# Patient Record
Sex: Male | Born: 1935 | Race: White | Hispanic: No | Marital: Married | Smoking: Never smoker
Health system: Southern US, Community
[De-identification: ages and names within clinical notes are randomized; demographics above are authoritative.]

## PROBLEM LIST (undated history)

## (undated) DIAGNOSIS — I493 Ventricular premature depolarization: Secondary | ICD-10-CM

## (undated) DIAGNOSIS — Z972 Presence of dental prosthetic device (complete) (partial): Secondary | ICD-10-CM

## (undated) DIAGNOSIS — E875 Hyperkalemia: Secondary | ICD-10-CM

## (undated) DIAGNOSIS — E785 Hyperlipidemia, unspecified: Secondary | ICD-10-CM

## (undated) DIAGNOSIS — K219 Gastro-esophageal reflux disease without esophagitis: Secondary | ICD-10-CM

## (undated) DIAGNOSIS — E119 Type 2 diabetes mellitus without complications: Secondary | ICD-10-CM

## (undated) DIAGNOSIS — E538 Deficiency of other specified B group vitamins: Secondary | ICD-10-CM

## (undated) DIAGNOSIS — N529 Male erectile dysfunction, unspecified: Secondary | ICD-10-CM

## (undated) DIAGNOSIS — J189 Pneumonia, unspecified organism: Secondary | ICD-10-CM

## (undated) DIAGNOSIS — N189 Chronic kidney disease, unspecified: Secondary | ICD-10-CM

## (undated) DIAGNOSIS — I10 Essential (primary) hypertension: Secondary | ICD-10-CM

## (undated) DIAGNOSIS — G4730 Sleep apnea, unspecified: Secondary | ICD-10-CM

## (undated) DIAGNOSIS — K5790 Diverticulosis of intestine, part unspecified, without perforation or abscess without bleeding: Secondary | ICD-10-CM

## (undated) DIAGNOSIS — R001 Bradycardia, unspecified: Secondary | ICD-10-CM

## (undated) DIAGNOSIS — D649 Anemia, unspecified: Secondary | ICD-10-CM

## (undated) HISTORY — PX: HERNIA REPAIR: SHX51

## (undated) HISTORY — PX: COLONOSCOPY: SHX174

---

## 2005-07-03 ENCOUNTER — Ambulatory Visit: Payer: Self-pay | Admitting: Gastroenterology

## 2012-03-05 DIAGNOSIS — N189 Chronic kidney disease, unspecified: Secondary | ICD-10-CM | POA: Insufficient documentation

## 2012-03-05 DIAGNOSIS — D649 Anemia, unspecified: Secondary | ICD-10-CM | POA: Insufficient documentation

## 2012-08-16 ENCOUNTER — Ambulatory Visit: Payer: Self-pay | Admitting: Radiology

## 2012-08-16 ENCOUNTER — Ambulatory Visit: Payer: Self-pay | Admitting: General Practice

## 2012-12-16 DIAGNOSIS — E538 Deficiency of other specified B group vitamins: Secondary | ICD-10-CM | POA: Insufficient documentation

## 2014-01-05 DIAGNOSIS — I10 Essential (primary) hypertension: Secondary | ICD-10-CM | POA: Insufficient documentation

## 2014-12-29 ENCOUNTER — Encounter: Payer: Self-pay | Admitting: Unknown Physician Specialty

## 2015-05-05 ENCOUNTER — Encounter: Payer: Self-pay | Admitting: Podiatry

## 2015-05-05 ENCOUNTER — Ambulatory Visit (INDEPENDENT_AMBULATORY_CARE_PROVIDER_SITE_OTHER): Payer: Medicare Other

## 2015-05-05 ENCOUNTER — Ambulatory Visit (INDEPENDENT_AMBULATORY_CARE_PROVIDER_SITE_OTHER): Payer: Medicare Other | Admitting: Podiatry

## 2015-05-05 VITALS — BP 108/74 | HR 71 | Resp 16

## 2015-05-05 DIAGNOSIS — B351 Tinea unguium: Secondary | ICD-10-CM | POA: Diagnosis not present

## 2015-05-05 DIAGNOSIS — M2040 Other hammer toe(s) (acquired), unspecified foot: Secondary | ICD-10-CM | POA: Diagnosis not present

## 2015-05-05 DIAGNOSIS — E875 Hyperkalemia: Secondary | ICD-10-CM | POA: Insufficient documentation

## 2015-05-05 DIAGNOSIS — E785 Hyperlipidemia, unspecified: Secondary | ICD-10-CM | POA: Insufficient documentation

## 2015-05-05 DIAGNOSIS — N183 Chronic kidney disease, stage 3 unspecified: Secondary | ICD-10-CM | POA: Insufficient documentation

## 2015-05-05 DIAGNOSIS — M79676 Pain in unspecified toe(s): Secondary | ICD-10-CM | POA: Diagnosis not present

## 2015-05-05 DIAGNOSIS — E119 Type 2 diabetes mellitus without complications: Secondary | ICD-10-CM

## 2015-05-05 DIAGNOSIS — M2010 Hallux valgus (acquired), unspecified foot: Secondary | ICD-10-CM

## 2015-05-05 DIAGNOSIS — E1122 Type 2 diabetes mellitus with diabetic chronic kidney disease: Secondary | ICD-10-CM | POA: Insufficient documentation

## 2015-05-05 DIAGNOSIS — N529 Male erectile dysfunction, unspecified: Secondary | ICD-10-CM | POA: Insufficient documentation

## 2015-05-05 NOTE — Patient Instructions (Signed)
Diabetes and Foot Care Diabetes may cause you to have problems because of poor blood supply (circulation) to your feet and legs. This may cause the skin on your feet to become thinner, break easier, and heal more slowly. Your skin may become dry, and the skin may peel and crack. You may also have nerve damage in your legs and feet causing decreased feeling in them. You may not notice minor injuries to your feet that could lead to infections or more serious problems. Taking care of your feet is one of the most important things you can do for yourself.  HOME CARE INSTRUCTIONS  Wear shoes at all times, even in the house. Do not go barefoot. Bare feet are easily injured.  Check your feet daily for blisters, cuts, and redness. If you cannot see the bottom of your feet, use a mirror or ask someone for help.  Wash your feet with warm water (do not use hot water) and mild soap. Then pat your feet and the areas between your toes until they are completely dry. Do not soak your feet as this can dry your skin.  Apply a moisturizing lotion or petroleum jelly (that does not contain alcohol and is unscented) to the skin on your feet and to dry, brittle toenails. Do not apply lotion between your toes.  Trim your toenails straight across. Do not dig under them or around the cuticle. File the edges of your nails with an emery board or nail file.  Do not cut corns or calluses or try to remove them with medicine.  Wear clean socks or stockings every day. Make sure they are not too tight. Do not wear knee-high stockings since they may decrease blood flow to your legs.  Wear shoes that fit properly and have enough cushioning. To break in new shoes, wear them for just a few hours a day. This prevents you from injuring your feet. Always look in your shoes before you put them on to be sure there are no objects inside.  Do not cross your legs. This may decrease the blood flow to your feet.  If you find a minor scrape,  cut, or break in the skin on your feet, keep it and the skin around it clean and dry. These areas may be cleansed with mild soap and water. Do not cleanse the area with peroxide, alcohol, or iodine.  When you remove an adhesive bandage, be sure not to damage the skin around it.  If you have a wound, look at it several times a day to make sure it is healing.  Do not use heating pads or hot water bottles. They may burn your skin. If you have lost feeling in your feet or legs, you may not know it is happening until it is too late.  Make sure your health care provider performs a complete foot exam at least annually or more often if you have foot problems. Report any cuts, sores, or bruises to your health care provider immediately. SEEK MEDICAL CARE IF:   You have an injury that is not healing.  You have cuts or breaks in the skin.  You have an ingrown nail.  You notice redness on your legs or feet.  You feel burning or tingling in your legs or feet.  You have pain or cramps in your legs and feet.  Your legs or feet are numb.  Your feet always feel cold. SEEK IMMEDIATE MEDICAL CARE IF:   There is increasing redness,   swelling, or pain in or around a wound.  There is a red line that goes up your leg.  Pus is coming from a wound.  You develop a fever or as directed by your health care provider.  You notice a bad smell coming from an ulcer or wound.   This information is not intended to replace advice given to you by your health care provider. Make sure you discuss any questions you have with your health care provider.   Document Released: 02/04/2000 Document Revised: 10/09/2012 Document Reviewed: 07/16/2012 Elsevier Interactive Patient Education 2016 [employer] Inc[employer]  

## 2015-05-05 NOTE — Progress Notes (Signed)
   Subjective:    Patient ID: Jermaine White, male    DOB: 04/27/1935, 80 y.o.   MRN: [id number]  HPI: He presents today with a chief concern of an overlapping second digit hallux left. He states that he is a diabetic but really has no podiatric issues associated with diabetes. 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 He states that he has always had bunion deformities, which she states comes from his family, however he has just recently developed the overlapping second digit on the left foot. He feels that it came about after [place]. He states that just seemed to pop up and rollover. He's concerned about excessive pressure of the second toe on the lateral aspect of the nail plate of the hallux. He states that there is some discoloration and discomfort. He denies trauma to the foot. He relates that his blood sugar runs very good and that his doctor is satisfied with his progress.    Review of Systems  Musculoskeletal: Positive for arthralgias.  All other systems reviewed and are negative.      Objective:   Physical Exam: I have reviewed his past medical history medications allergy surgery social history and review of systems. Vital signs are stable he is alert and oriented 3. He presents in no apparent distress. Pulses are strongly palpable capillary fill times immediate bilateral. Neurologic sensorium is intact per Semmes-Weinstein monofilament and vibratory sensation. Deep tendon reflexes are intact and muscle strength appears to be +5/5 dorsiflexors plantar flexors and inverters and everters. Orthopedic evaluation demonstrates a rectus rear foot and ankle with severe bunion deformities bilateral. Hammertoe deformity second digit bilateral is present with the left foot being the worst. There is a complete dislocation of the second toe on the left foot which overlaps the hallux from lateral to medial. Radiographs confirm complete dislocation of the  second toe bilaterally with severe hammertoe deformity. Severe bunion deformities with an increase in the first intermetatarsal angle greater than 25. Hallux abductus angle is high as well and is resulting in dislocation/subluxation of the first metatarsophalangeal joint. Bone stock appears to be relatively normal. There are no other lesions or wounds. He does have some discoloration to the lateral nailbed associated with bruising from the pressure of the second toe from shoe gear. His toenails are long and dystrophic and painful on palpation.      Assessment & Plan:  Assessment: Diabetes mellitus without diabetic complication. Severe hallux abductovalgus deformity bilateral. Severe hammertoe deformity second bilateral left greater than right with complete dislocation of the second metatarsophalangeal joint left foot. Pain in limb secondary to nail dystrophy bilateral.  Plan: Discussed etiology pathology conservative versus surgical therapies. He is currently requesting surgical intervention. I expressed to him that the best option with a painful second toe and the pressure upon the hallux would be to remove the toe itself. I don't think that he was very excited about this proposition. I expressed to him that it would be nearly impossible to correct such a severe dislocation and a severe bunion deformity. He would like another opinion. At this point I'm requesting that he follow-up with Dr. Ardelle AntonWagoner for second opinion. I also debrided his nails today.

## 2015-05-25 ENCOUNTER — Encounter: Payer: Self-pay | Admitting: Podiatry

## 2015-05-25 ENCOUNTER — Ambulatory Visit (INDEPENDENT_AMBULATORY_CARE_PROVIDER_SITE_OTHER): Payer: Medicare Other | Admitting: Podiatry

## 2015-05-25 VITALS — BP 87/62 | HR 83 | Resp 18

## 2015-05-25 DIAGNOSIS — M2010 Hallux valgus (acquired), unspecified foot: Secondary | ICD-10-CM | POA: Diagnosis not present

## 2015-05-25 DIAGNOSIS — M2040 Other hammer toe(s) (acquired), unspecified foot: Secondary | ICD-10-CM | POA: Diagnosis not present

## 2015-05-25 NOTE — Progress Notes (Signed)
Patient ID: Jermaine White, male   DOB: 06/25/1935, 80 y.o.   MRN: [id number]  Subjective: 80 year old male presents the office today for second opinion in regards to painful second toe overlapping the hallux left side worse than the right. He is tried shoe gear modifications, padding offloading without any relief. Continue pain the second toe. He recently saw Dr.-recommended amputation of the second toe. Denies any systemic complaints such as fevers, chills, nausea, vomiting. No acute changes since last appointment, and no other complaints at this time.   Objective: AAO x3, NAD DP/PT pulses palpable bilaterally, CRT less than 3 seconds Protective sensation intact with Simms Weinstein monofilament There is severe HAV present bilaterally the left side worse in the right second digit is chronically dislocated overlapping the hallux. There is tenderness overlying the second toe. There is no edema. There is slight erythema overlying the medial facet of the first metatarsal and the dorsal second toe from irritation shoe gear. No other areas of tenderness.  No areas of pinpoint bony tenderness or pain with vibratory sensation. MMT 5/5, ROM WNL. No edema, erythema, increase in warmth to bilateral lower extremities.  No open lesions or pre-ulcerative lesions.  No pain with calf compression, swelling, warmth, erythema  Assessment: 80 year old male with significant HAV present and chronically dislocated second MTPJ.  Plan: -All treatment options discussed with the patient including all alternatives, risks, complications.  -Previous x-rays are reviewed. -Treatment options were discussed including both conservative and surgical treatment options. I discussed conservative treatment including shoe changes and more comminuted insert from what he has now. If he would like to proceed with surgery I discussed multiple treatment options. Assessment second toe amputation versus reconstruction. He'll consider his  options. 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 -Patient encouraged to call the office with any questions, concerns, change in symptoms.   Ovid CurdMatthew Wagoner, DPM

## 2015-08-18 ENCOUNTER — Ambulatory Visit: Payer: Medicare Other | Admitting: Podiatry

## 2015-09-03 ENCOUNTER — Other Ambulatory Visit: Payer: Self-pay | Admitting: Physician Assistant

## 2015-09-03 DIAGNOSIS — M5416 Radiculopathy, lumbar region: Secondary | ICD-10-CM

## 2015-09-06 ENCOUNTER — Ambulatory Visit
Admission: RE | Admit: 2015-09-06 | Discharge: 2015-09-06 | Disposition: A | Discharge status: Home / Self Care | Payer: Medicare Other | Source: Ambulatory Visit | Attending: Physician Assistant | Admitting: Physician Assistant

## 2015-09-06 DIAGNOSIS — M4806 Spinal stenosis, lumbar region: Secondary | ICD-10-CM | POA: Diagnosis not present

## 2015-09-06 DIAGNOSIS — M5416 Radiculopathy, lumbar region: Secondary | ICD-10-CM | POA: Diagnosis not present

## 2015-09-06 DIAGNOSIS — M5126 Other intervertebral disc displacement, lumbar region: Secondary | ICD-10-CM | POA: Insufficient documentation

## 2015-09-13 ENCOUNTER — Ambulatory Visit: Payer: Medicare Other | Admitting: Podiatry

## 2015-09-15 ENCOUNTER — Ambulatory Visit (INDEPENDENT_AMBULATORY_CARE_PROVIDER_SITE_OTHER): Payer: Medicare Other | Admitting: Podiatry

## 2015-09-15 DIAGNOSIS — M79676 Pain in unspecified toe(s): Secondary | ICD-10-CM | POA: Diagnosis not present

## 2015-09-15 DIAGNOSIS — B351 Tinea unguium: Secondary | ICD-10-CM

## 2015-09-15 NOTE — Progress Notes (Signed)
He presents today for follow-up of his painfully elongated toenails. Reminding me that he is diabetic.  Objective: Vital signs are stable he is alert and oriented 3. Pulses are palpable. Severe hammertoe deformity with overlapping hammertoe and bunion deformity. Toenails are thick yellow dystrophic onychomycotic. No open lesions or wounds noted.  Assessment: Diabetes mellitus severe digital deformities bilateral. Pain in limb secondary to onychomycosis.  Plan: Debridement of toenails 1 through 5 bilateral. Follow up with me in 3 months

## 2015-12-14 ENCOUNTER — Ambulatory Visit (INDEPENDENT_AMBULATORY_CARE_PROVIDER_SITE_OTHER): Payer: Medicare Other | Admitting: Podiatry

## 2015-12-14 DIAGNOSIS — E0843 Diabetes mellitus due to underlying condition with diabetic autonomic (poly)neuropathy: Secondary | ICD-10-CM

## 2015-12-14 DIAGNOSIS — B351 Tinea unguium: Secondary | ICD-10-CM | POA: Diagnosis not present

## 2015-12-14 DIAGNOSIS — L608 Other nail disorders: Secondary | ICD-10-CM

## 2015-12-14 DIAGNOSIS — L603 Nail dystrophy: Secondary | ICD-10-CM

## 2015-12-14 DIAGNOSIS — M79609 Pain in unspecified limb: Secondary | ICD-10-CM

## 2015-12-15 ENCOUNTER — Ambulatory Visit: Payer: Medicare Other | Admitting: Podiatry

## 2015-12-26 NOTE — Progress Notes (Signed)
SUBJECTIVE Patient with a history of diabetes mellitus presents to office today complaining of elongated, thickened nails. Pain while ambulating in shoes. Patient is unable to trim their own nails.   Allergies  Allergen Reactions  . Sulfa Antibiotics Other (See Comments)    OBJECTIVE General Patient is awake, alert, and oriented x 3 and in no acute distress. Derm Skin is dry and supple bilateral. Negative open lesions or macerations. Remaining integument unremarkable. Nails are tender, long, thickened and dystrophic with subungual debris, consistent with onychomycosis, 1-5 bilateral. No signs of infection noted. Vasc  DP and PT pedal pulses palpable bilaterally. Temperature gradient within normal limits.  Neuro Epicritic and protective threshold sensation diminished bilaterally.  Musculoskeletal Exam No symptomatic pedal deformities noted bilateral. Muscular strength within normal limits.  ASSESSMENT 1. Diabetes Mellitus w/ peripheral neuropathy 2. Onychomycosis of nail due to dermatophyte bilateral 3. Pain in foot bilateral  PLAN OF CARE 1. Patient evaluated today. 2. Instructed to maintain good pedal hygiene and foot care. Stressed importance of controlling blood sugar.  3. Mechanical debridement of nails 1-5 bilaterally performed using a nail nipper. Filed with dremel without incident.  4. Return to clinic in 3 mos.    Felecia ShellingBrent M Ossie Yebra, DPM

## 2016-01-25 ENCOUNTER — Ambulatory Visit: Payer: Medicare Other | Admitting: Registered Nurse

## 2016-01-25 ENCOUNTER — Encounter
Admission: RE | Disposition: A | Discharge status: Home / Self Care | Payer: Self-pay | Source: Ambulatory Visit | Attending: Gastroenterology

## 2016-01-25 ENCOUNTER — Ambulatory Visit
Admission: RE | Admit: 2016-01-25 | Discharge: 2016-01-25 | Disposition: A | Discharge status: Home / Self Care | Payer: Medicare Other | Source: Ambulatory Visit | Attending: Gastroenterology | Admitting: Gastroenterology

## 2016-01-25 DIAGNOSIS — G4730 Sleep apnea, unspecified: Secondary | ICD-10-CM | POA: Diagnosis not present

## 2016-01-25 DIAGNOSIS — E538 Deficiency of other specified B group vitamins: Secondary | ICD-10-CM | POA: Insufficient documentation

## 2016-01-25 DIAGNOSIS — I129 Hypertensive chronic kidney disease with stage 1 through stage 4 chronic kidney disease, or unspecified chronic kidney disease: Secondary | ICD-10-CM | POA: Diagnosis not present

## 2016-01-25 DIAGNOSIS — Z7982 Long term (current) use of aspirin: Secondary | ICD-10-CM | POA: Diagnosis not present

## 2016-01-25 DIAGNOSIS — K219 Gastro-esophageal reflux disease without esophagitis: Secondary | ICD-10-CM | POA: Insufficient documentation

## 2016-01-25 DIAGNOSIS — K5790 Diverticulosis of intestine, part unspecified, without perforation or abscess without bleeding: Secondary | ICD-10-CM

## 2016-01-25 DIAGNOSIS — N189 Chronic kidney disease, unspecified: Secondary | ICD-10-CM | POA: Diagnosis not present

## 2016-01-25 DIAGNOSIS — E785 Hyperlipidemia, unspecified: Secondary | ICD-10-CM | POA: Insufficient documentation

## 2016-01-25 DIAGNOSIS — Z7984 Long term (current) use of oral hypoglycemic drugs: Secondary | ICD-10-CM | POA: Diagnosis not present

## 2016-01-25 DIAGNOSIS — E875 Hyperkalemia: Secondary | ICD-10-CM | POA: Diagnosis not present

## 2016-01-25 DIAGNOSIS — E1122 Type 2 diabetes mellitus with diabetic chronic kidney disease: Secondary | ICD-10-CM | POA: Diagnosis not present

## 2016-01-25 DIAGNOSIS — Z1211 Encounter for screening for malignant neoplasm of colon: Secondary | ICD-10-CM | POA: Diagnosis present

## 2016-01-25 DIAGNOSIS — Z79899 Other long term (current) drug therapy: Secondary | ICD-10-CM | POA: Diagnosis not present

## 2016-01-25 HISTORY — DX: Chronic kidney disease, unspecified: N18.9

## 2016-01-25 HISTORY — DX: Sleep apnea, unspecified: G47.30

## 2016-01-25 HISTORY — DX: Anemia, unspecified: D64.9

## 2016-01-25 HISTORY — DX: Male erectile dysfunction, unspecified: N52.9

## 2016-01-25 HISTORY — DX: Type 2 diabetes mellitus without complications: E11.9

## 2016-01-25 HISTORY — DX: Gastro-esophageal reflux disease without esophagitis: K21.9

## 2016-01-25 HISTORY — DX: Hyperkalemia: E87.5

## 2016-01-25 HISTORY — DX: Essential (primary) hypertension: I10

## 2016-01-25 HISTORY — PX: COLONOSCOPY WITH PROPOFOL: SHX5780

## 2016-01-25 HISTORY — DX: Hyperlipidemia, unspecified: E78.5

## 2016-01-25 HISTORY — DX: Diverticulosis of intestine, part unspecified, without perforation or abscess without bleeding: K57.90

## 2016-01-25 HISTORY — DX: Deficiency of other specified B group vitamins: E53.8

## 2016-01-25 LAB — GLUCOSE, CAPILLARY: GLUCOSE-CAPILLARY: 152 mg/dL — AB (ref 65–99)

## 2016-01-25 SURGERY — COLONOSCOPY WITH PROPOFOL
Anesthesia: General

## 2016-01-25 MED ORDER — EPHEDRINE SULFATE 50 MG/ML IJ SOLN
INTRAMUSCULAR | Status: DC | PRN
  Administered 2016-01-25: 5 mg via INTRAVENOUS

## 2016-01-25 MED ORDER — SODIUM CHLORIDE 0.9 % IV SOLN: INTRAVENOUS | Status: DC

## 2016-01-25 MED ORDER — PHENYLEPHRINE HCL 10 MG/ML IJ SOLN
INTRAMUSCULAR | Status: DC | PRN
  Administered 2016-01-25 (×4): 100 ug via INTRAVENOUS

## 2016-01-25 MED ORDER — PROPOFOL 10 MG/ML IV BOLUS
INTRAVENOUS | Status: DC | PRN
  Administered 2016-01-25: 20 mg via INTRAVENOUS
  Administered 2016-01-25: 50 mg via INTRAVENOUS

## 2016-01-25 MED ORDER — SODIUM CHLORIDE 0.9 % IV SOLN
INTRAVENOUS | Status: DC
  Administered 2016-01-25: 08:00:00 via INTRAVENOUS

## 2016-01-25 MED ORDER — PROPOFOL 500 MG/50ML IV EMUL
INTRAVENOUS | Status: DC | PRN
  Administered 2016-01-25: 150 ug/kg/min via INTRAVENOUS

## 2016-01-25 NOTE — Op Note (Signed)
[facility] Gastroenterology Patient Name: Jermaine BowlerGeorge White Procedure Date: 01/25/2016 9:04 AM MRN: [id number] Account #: [id number] Date of Birth: 03/02/1935 Admit Type: Outpatient Age: 80 [facility] 3 Gender: Male Note Status: Finalized Procedure:            Colonoscopy Indications:          Screening for colorectal malignant neoplasm Providers:            Christena DeemMartin U. Skulskie, MD Medicines:            Monitored Anesthesia Care Complications:        No immediate complications. Procedure:            Pre-Anesthesia Assessment:                       - ASA Grade Assessment: II - A patient with mild                        systemic disease.                       After obtaining informed consent, the colonoscope was                        passed under direct vision. Throughout the procedure,                        the patient's blood pressure, pulse, and oxygen                        saturations were monitored continuously. The                        Colonoscope was introduced through the anus and                        advanced to the the cecum, identified by appendiceal                        orifice and ileocecal valve. The colonoscopy was                        performed without difficulty. The patient tolerated the                        procedure well. The quality of the bowel preparation                        was good. Findings:      A few small-mouthed diverticula were found in the sigmoid colon.      The digital rectal exam findings include enlarged prostate, otherwise       normal.      The retroflexed view of the distal rectum and anal verge was normal and       showed no anal or rectal abnormalities. Impression:           - Diverticulosis in the sigmoid colon.                       - Enlarged prostate found on digital rectal exam.                       -  The distal rectum and anal verge are normal on                        retroflexion view.                 - No specimens collected. Recommendation:       - Discharge patient to home.                       - No repeat colonoscopy due to age. Procedure Code(s):    --- Professional ---                       [id number], Colonoscopy, flexible; diagnostic, including                        collection of specimen(s) by brushing or washing, when                        performed (separate procedure) Diagnosis Code(s):    --- Professional ---                       Z12.11, Encounter for screening for malignant neoplasm                        of colon                       K57.30, Diverticulosis of large intestine without                        perforation or abscess without bleeding                       N40.0, Benign prostatic hyperplasia without lower                        urinary tract symptoms CPT copyright 2016 American Medical Association. All rights reserved. The codes documented in this report are preliminary and upon coder review may  be revised to meet current compliance requirements. Christena DeemMartin U Skulskie, MD 01/25/2016 9:23:28 AM This report has been signed electronically. Number of Addenda: 0 Note Initiated On: 01/25/2016 9:04 AM      [facility]

## 2016-01-25 NOTE — H&P (Signed)
Outpatient short stay form Pre-procedure 01/25/2016 8:31 AM Christena DeemMartin U Skulskie MD  Primary Physician: Dr. Leim FabryBarbara Aldridge  Reason for visit:  Colonoscopy History of present illness:   Patient is a 80 year old male presenting today as above. He has had a colonoscopy in the past. This was apparently normal. He tolerated his prep well. He does take a daily aspirin the last time yesterday. This 81 mg. He takes no other aspirin products or blood thinning agents.     Current Facility-Administered Medications:  .  0.9 %  sodium chloride infusion, , Intravenous, Continuous, Christena DeemMartin U Skulskie, MD, Last Rate: 20 mL/hr at 01/25/16 0802 .  0.9 %  sodium chloride infusion, , Intravenous, Continuous, Christena DeemMartin U Skulskie, MD  Prescriptions Prior to Admission  Medication Sig Dispense Refill Last Dose  . Aspirin (ASPIR-81 PO) Take by mouth.   01/24/2016 at Unknown time  . docusate sodium (STOOL SOFTENER) 100 MG capsule Take 100 mg by mouth.   Past Month at Unknown time  . Glucosamine-Chondroitin-MSM (RA GLUCOSAMINE-CHONDROIT-MSM) 500-400-300 MG TABS Take by mouth.   01/19/2016 at Unknown time  . lisinopril-hydrochlorothiazide (PRINZIDE,ZESTORETIC) 10-12.5 MG tablet    01/24/2016 at Unknown time  . lovastatin (MEVACOR) 40 MG tablet    01/23/2016 at Unknown time  . metFORMIN (GLUCOPHAGE) 500 MG tablet Take 1,000 mg by mouth 2 (two) times daily with a meal.   01/24/2016 at Unknown time  . vitamin B-12 (CYANOCOBALAMIN) 1000 MCG tablet Take by mouth.   01/19/2016 at Unknown time  . ACCU-CHEK COMPACT PLUS test strip CHECK BLOOD SUGAR TWICE A DAY. (DX E11.29)  4 Taking  . Boswellia-Glucosamine-Vit D (GLUCOSAMINE COMPLEX PO)    Taking  . metFORMIN (GLUCOPHAGE-XR) 500 MG 24 hr tablet      . sildenafil (REVATIO) 20 MG tablet    Taking  . UNABLE TO FIND    Taking     Allergies  Allergen Reactions  . Sulfa Antibiotics Other (See Comments)     Past Medical History:  Diagnosis Date  . Anemia   . B12 deficiency    . Chronic kidney disease    chronic renal insufficency  . Diabetes mellitus without complication (HCC)   . GERD (gastroesophageal reflux disease)   . Hyperkalemia   . Hyperlipidemia   . Hypertension   . Impotence   . Sleep apnea     Review of systems:      Physical Exam    Heart and lungs: Regular rate and rhythm without rub or gallop, lungs are bilaterally clear.    HEENT: Normocephalic atraumatic eyes are anicteric    Other:     Pertinant exam for procedure: Soft nontender nondistended bowel sounds positive normoactive.    Planned proceedures: Colonoscopy and indicated procedures. I have discussed the risks benefits and complications of procedures to include not limited to bleeding, infection, perforation and the risk of sedation and the patient wishes to proceed.    Christena DeemMartin U Skulskie, MD Gastroenterology 01/25/2016  8:31 AM

## 2016-01-25 NOTE — Anesthesia Preprocedure Evaluation (Signed)
Anesthesia Evaluation  Patient identified by MRN, date of birth, ID band Patient awake    Reviewed: Allergy & Precautions, NPO status , Patient's Chart, lab work & pertinent test results  History of Anesthesia Complications Negative for: history of anesthetic complications  Airway Mallampati: II  TM Distance: >3 FB Neck ROM: Full    Dental  (+) Partial Lower, Poor Dentition   Pulmonary Sleep apnea: hx of OSA, lost 50lbs and no longer has OSA. , neg COPD,    breath sounds clear to auscultation- rhonchi (-) wheezing      Cardiovascular hypertension, Pt. on medications (-) CAD and (-) Past MI  Rhythm:Regular Rate:Normal - Systolic murmurs and - Diastolic murmurs    Neuro/Psych negative neurological ROS  negative psych ROS   GI/Hepatic GERD  ,  Endo/Other  diabetes, Type 2, Oral Hypoglycemic Agents  Renal/GU Renal disease (followed by nephrology, never had dialysis)     Musculoskeletal negative musculoskeletal ROS (+)   Abdominal (+) - obese,   Peds  Hematology  (+) anemia ,   Anesthesia Other Findings Past Medical History: No date: Anemia No date: B12 deficiency No date: Chronic kidney disease     Comment: chronic renal insufficency No date: Diabetes mellitus without complication (HCC) No date: GERD (gastroesophageal reflux disease) No date: Hyperkalemia No date: Hyperlipidemia No date: Hypertension No date: Impotence No date: Sleep apnea   Reproductive/Obstetrics                             Anesthesia Physical Anesthesia Plan  ASA: III  Anesthesia Plan: General   Post-op Pain Management:    Induction: Intravenous  Airway Management Planned: Natural Airway  Additional Equipment:   Intra-op Plan:   Post-operative Plan:   Informed Consent: I have reviewed the patients History and Physical, chart, labs and discussed the procedure including the risks, benefits and  alternatives for the proposed anesthesia with the patient or authorized representative who has indicated his/her understanding and acceptance.   Dental advisory given  Plan Discussed with: CRNA and Anesthesiologist  Anesthesia Plan Comments:         Anesthesia Quick Evaluation

## 2016-01-25 NOTE — Transfer of Care (Signed)
Immediate Anesthesia Transfer of Care Note  Patient: Jermaine White  Procedure(s) Performed: Procedure(s): COLONOSCOPY WITH PROPOFOL (N/A)  Patient Location: PACU  Anesthesia Type:General  Level of Consciousness: awake and alert   Airway & Oxygen Therapy: Patient Spontanous Breathing and Patient connected to nasal cannula oxygen  Post-op Assessment: Report given to RN and Post -op Vital signs reviewed and stable  Post vital signs: Reviewed and stable  Last Vitals:  Vitals:   01/25/16 0754 01/25/16 0909  BP: 119/79   Pulse: 86 81  Resp: 16 18  Temp: (!) 35.6 C (!) 35.9 C    Last Pain:  Vitals:   01/25/16 0909  TempSrc: Tympanic         Complications: No apparent anesthesia complications

## 2016-01-25 NOTE — Anesthesia Postprocedure Evaluation (Signed)
Anesthesia Post Note  Patient: Randa LynnGeorge D Huser  Procedure(s) Performed: Procedure(s) (LRB): COLONOSCOPY WITH PROPOFOL (N/A)  Patient location during evaluation: Endoscopy Anesthesia Type: General Level of consciousness: awake and alert and oriented Pain management: pain level controlled Vital Signs Assessment: post-procedure vital signs reviewed and stable Respiratory status: spontaneous breathing, nonlabored ventilation and respiratory function stable Cardiovascular status: blood pressure returned to baseline and stable Postop Assessment: no signs of nausea or vomiting Anesthetic complications: no    Last Vitals:  Vitals:   01/25/16 0910 01/25/16 0920  BP: (!) 85/49 92/60  Pulse: 78 71  Resp: 13 15  Temp:      Last Pain:  Vitals:   01/25/16 0909  TempSrc: Tympanic                 Saudia Smyser

## 2016-01-26 ENCOUNTER — Encounter: Payer: Self-pay | Admitting: Gastroenterology

## 2016-03-17 ENCOUNTER — Ambulatory Visit (INDEPENDENT_AMBULATORY_CARE_PROVIDER_SITE_OTHER): Payer: Medicare Other | Admitting: Podiatry

## 2016-03-17 DIAGNOSIS — L608 Other nail disorders: Secondary | ICD-10-CM | POA: Diagnosis not present

## 2016-03-17 DIAGNOSIS — M79609 Pain in unspecified limb: Secondary | ICD-10-CM | POA: Diagnosis not present

## 2016-03-17 DIAGNOSIS — M21619 Bunion of unspecified foot: Secondary | ICD-10-CM

## 2016-03-17 DIAGNOSIS — M2042 Other hammer toe(s) (acquired), left foot: Secondary | ICD-10-CM

## 2016-03-17 DIAGNOSIS — L603 Nail dystrophy: Secondary | ICD-10-CM

## 2016-03-17 DIAGNOSIS — E0843 Diabetes mellitus due to underlying condition with diabetic autonomic (poly)neuropathy: Secondary | ICD-10-CM

## 2016-03-17 DIAGNOSIS — B351 Tinea unguium: Secondary | ICD-10-CM

## 2016-03-17 DIAGNOSIS — M2041 Other hammer toe(s) (acquired), right foot: Secondary | ICD-10-CM

## 2016-03-17 DIAGNOSIS — M2010 Hallux valgus (acquired), unspecified foot: Secondary | ICD-10-CM

## 2016-03-26 NOTE — Progress Notes (Signed)
   SUBJECTIVE Patient with a history of diabetes mellitus presents to office today complaining of elongated, thickened nails. Pain while ambulating in shoes. Patient is unable to trim their own nails.   OBJECTIVE General Patient is awake, alert, and oriented x 3 and in no acute distress. Derm Skin is dry and supple bilateral. Negative open lesions or macerations. Remaining integument unremarkable. Nails are tender, long, thickened and dystrophic with subungual debris, consistent with onychomycosis, 1-5 bilateral. No signs of infection noted. Vasc  DP and PT pedal pulses palpable bilaterally. Temperature gradient within normal limits.  Neuro Epicritic and protective threshold sensation diminished bilaterally.  Musculoskeletal Exam bilateral bunion deformity with hammertoes to-5 bilateral. No symptomatic pedal deformities noted bilateral. Muscular strength within normal limits.  ASSESSMENT 1. Diabetes Mellitus w/ peripheral neuropathy 2. Onychomycosis of nail due to dermatophyte bilateral 3. Pain in foot bilateral 4. Bunion deformity bilateral 5. Hammertoe digit deformity to-5 bilateral  PLAN OF CARE 1. Patient evaluated today. 2. Instructed to maintain good pedal hygiene and foot care. Stressed importance of controlling blood sugar.  3. Mechanical debridement of nails 1-5 bilaterally performed using a nail nipper. Filed with dremel without incident.  4. Return to clinic in 3 mos.     Felecia ShellingBrent M. Conchita Truxillo, DPM [facility]  Dr. Felecia ShellingBrent M. Batina Dougan, DPM    [address]                                        [place], [place] [address]                Office [phone]  Fax [phone]

## 2016-04-06 ENCOUNTER — Other Ambulatory Visit: Payer: Self-pay | Admitting: Neurosurgery

## 2016-04-06 DIAGNOSIS — M48062 Spinal stenosis, lumbar region with neurogenic claudication: Secondary | ICD-10-CM

## 2016-04-13 ENCOUNTER — Ambulatory Visit
Admission: RE | Admit: 2016-04-13 | Discharge: 2016-04-13 | Disposition: A | Discharge status: Home / Self Care | Payer: Medicare Other | Source: Ambulatory Visit | Attending: Neurosurgery | Admitting: Neurosurgery

## 2016-04-13 DIAGNOSIS — M48062 Spinal stenosis, lumbar region with neurogenic claudication: Secondary | ICD-10-CM

## 2016-04-13 MED ORDER — IOPAMIDOL (ISOVUE-M 200) INJECTION 41%
15.0000 mL | Freq: Once | INTRAMUSCULAR | Status: AC
  Administered 2016-04-13: 15 mL via INTRATHECAL

## 2016-04-13 MED ORDER — DIAZEPAM 5 MG PO TABS
5.0000 mg | ORAL_TABLET | Freq: Once | ORAL | Status: AC
  Administered 2016-04-13: 5 mg via ORAL

## 2016-04-13 NOTE — Discharge Instructions (Signed)

## 2016-04-19 NOTE — Progress Notes (Signed)
Pt states he had no headache. Has not heard from his physician.

## 2016-06-16 ENCOUNTER — Encounter: Payer: Self-pay | Admitting: Podiatry

## 2016-06-16 ENCOUNTER — Ambulatory Visit (INDEPENDENT_AMBULATORY_CARE_PROVIDER_SITE_OTHER): Payer: Medicare Other | Admitting: Podiatry

## 2016-06-16 DIAGNOSIS — E0842 Diabetes mellitus due to underlying condition with diabetic polyneuropathy: Secondary | ICD-10-CM

## 2016-06-16 DIAGNOSIS — B351 Tinea unguium: Secondary | ICD-10-CM

## 2016-06-16 DIAGNOSIS — M79609 Pain in unspecified limb: Secondary | ICD-10-CM | POA: Diagnosis not present

## 2016-06-16 DIAGNOSIS — L603 Nail dystrophy: Secondary | ICD-10-CM

## 2016-06-16 DIAGNOSIS — L608 Other nail disorders: Secondary | ICD-10-CM

## 2016-06-17 NOTE — Progress Notes (Signed)
   SUBJECTIVE Patient with a history of diabetes mellitus presents to office today complaining of elongated, thickened nails. Pain while ambulating in shoes. Patient is unable to trim their own nails.   OBJECTIVE General Patient is awake, alert, and oriented x 3 and in no acute distress. Derm Skin is dry and supple bilateral. Negative open lesions or macerations. Remaining integument unremarkable. Nails are tender, long, thickened and dystrophic with subungual debris, consistent with onychomycosis, 1-5 bilateral. No signs of infection noted. Vasc  DP and PT pedal pulses palpable bilaterally. Temperature gradient within normal limits.  Neuro Epicritic and protective threshold sensation diminished bilaterally.  Musculoskeletal Exam No symptomatic pedal deformities noted bilateral. Muscular strength within normal limits.  ASSESSMENT 1. Diabetes Mellitus w/ peripheral neuropathy 2. Onychomycosis of nail due to dermatophyte bilateral 3. Pain in foot bilateral  PLAN OF CARE 1. Patient evaluated today. 2. Instructed to maintain good pedal hygiene and foot care. Stressed importance of controlling blood sugar.  3. Mechanical debridement of nails 1-5 bilaterally performed using a nail nipper. Filed with dremel without incident.  4. Return to clinic in 3 mos.     Chablis Losh M. Leory Allinson, DPM [facility]  Dr. Malissa Slay M. Toinette Lackie, DPM    [address]                                        [place], [place] [address]                Office [phone]  Fax [phone]       

## 2016-09-15 ENCOUNTER — Encounter: Payer: Self-pay | Admitting: Podiatry

## 2016-09-15 ENCOUNTER — Ambulatory Visit (INDEPENDENT_AMBULATORY_CARE_PROVIDER_SITE_OTHER): Payer: Medicare Other | Admitting: Podiatry

## 2016-09-15 DIAGNOSIS — M79676 Pain in unspecified toe(s): Secondary | ICD-10-CM

## 2016-09-15 DIAGNOSIS — B351 Tinea unguium: Secondary | ICD-10-CM

## 2016-09-15 DIAGNOSIS — M2042 Other hammer toe(s) (acquired), left foot: Secondary | ICD-10-CM

## 2016-09-15 DIAGNOSIS — M21619 Bunion of unspecified foot: Secondary | ICD-10-CM

## 2016-09-15 DIAGNOSIS — M2041 Other hammer toe(s) (acquired), right foot: Secondary | ICD-10-CM

## 2016-09-15 DIAGNOSIS — E0842 Diabetes mellitus due to underlying condition with diabetic polyneuropathy: Secondary | ICD-10-CM

## 2016-09-15 NOTE — Progress Notes (Signed)
   SUBJECTIVE Patient with a history of diabetes mellitus presents to office today complaining of elongated, thickened nails. Pain while ambulating in shoes. Patient is unable to trim their own nails.   OBJECTIVE General Patient is awake, alert, and oriented x 3 and in no acute distress. Derm Skin is dry and supple bilateral. Negative open lesions or macerations. Remaining integument unremarkable. Nails are tender, long, thickened and dystrophic with subungual debris, consistent with onychomycosis, 1-5 bilateral. No signs of infection noted. Vasc  DP and PT pedal pulses palpable bilaterally. Temperature gradient within normal limits.  Neuro Epicritic and protective threshold sensation diminished bilaterally.  Musculoskeletal Exam clinical evidence of hammertoe contracture deformity digits 2-5 of the bilateral feet hallux abductovalgus bilateral. There is a crossover deformity of the hallux with the second digit.  ASSESSMENT 1. Diabetes Mellitus w/ peripheral neuropathy 2. Onychomycosis of nail due to dermatophyte bilateral 3. Hallux abductovalgus deformity bilateral 4. Hammertoe contracture deformity 2-5 bilateral  PLAN OF CARE 1. Patient evaluated today. 2. Instructed to maintain good pedal hygiene and foot care. Stressed importance of controlling blood sugar.  3. Mechanical debridement of nails 1-5 bilaterally performed using a nail nipper. Filed with dremel without incident.  4. Today we discussed conservative versus surgical management of bunion and hammertoe deformity. At the moment the patient is going to consider surgical management however he will continue conservative management including wider shoe gear and padding. I told the patient to let me know is ready consider surgical intervention if the condition worsens. 5. Return to clinic in 3 months     Felecia ShellingBrent M. Lendy Dittrich, DPM [facility]  Dr. Felecia ShellingBrent M. Marveline Profeta, DPM    [address]                                         [place], [place] [address]                Office [phone]  Fax [phone]

## 2016-10-06 ENCOUNTER — Other Ambulatory Visit: Payer: Self-pay | Admitting: Neurosurgery

## 2016-10-13 NOTE — Pre-Procedure Instructions (Addendum)
Jermaine White  10/13/2016      [facility] [address] [place] - [address] [place] [address] Phone: [phone] Fax: [phone]    Your procedure is scheduled on Wednesday August 29.  Report to [facility] Admitting at 9:15 A.M.  Call this number if you have problems the morning of surgery:  [phone]   Remember:  Do not eat food or drink liquids after midnight.  Take these medicines the morning of surgery with A SIP OF WATER: tylenol (if needed), flexeril if needed, gabapentin (neurontin)  7 days prior to surgery STOP taking any Aspirin, Aleve, Naproxen, Ibuprofen, Motrin, Advil, Goody's, BC's, all herbal medications, fish oil, and all vitamins  WHAT DO I DO ABOUT MY DIABETES MEDICATION?   Marland Kitchen Do not take oral diabetes medicines (pills) the morning of surgery. DO NOT TAKE metformin (glucophage), or glipizide (Glucotrol) the day of surgery   . ONLY take MORNING AND/OR lunch doses of Glipizide (Glucotrol) the day before surgery.     How to Manage Your Diabetes Before and After Surgery  Why is it important to control my blood sugar before and after surgery? . Improving blood sugar levels before and after surgery helps healing and can limit problems. . A way of improving blood sugar control is eating a healthy diet by: o  Eating less sugar and carbohydrates o  Increasing activity/exercise o  Talking with your doctor about reaching your blood sugar goals . High blood sugars (greater than 180 mg/dL) can raise your risk of infections and slow your recovery, so you will need to focus on controlling your diabetes during the weeks before surgery. . Make sure that the doctor who takes care of your diabetes knows about your planned surgery including the date and location.  How do I manage my blood sugar before surgery? . Check your blood sugar at least 4 times a day, starting 2 days before surgery, to make sure that the level is  not too high or low. o Check your blood sugar the morning of your surgery when you wake up and every 2 hours until you get to the Short Stay unit. . If your blood sugar is less than 70 mg/dL, you will need to treat for low blood sugar: o Do not take insulin. o Treat a low blood sugar (less than 70 mg/dL) with  cup of clear juice (cranberry or apple), 4 glucose tablets, OR glucose gel. o Recheck blood sugar in 15 minutes after treatment (to make sure it is greater than 70 mg/dL). If your blood sugar is not greater than 70 mg/dL on recheck, call [phone] for further instructions. . Report your blood sugar to the short stay nurse when you get to Short Stay.  . If you are admitted to the hospital after surgery: o Your blood sugar will be checked by the staff and you will probably be given insulin after surgery (instead of oral diabetes medicines) to make sure you have good blood sugar levels. o The goal for blood sugar control after surgery is 80-180 mg/dL.                 Do not wear jewelry, make-up or nail polish.  Do not wear lotions, powders, or perfumes, or deoderant.  Do not shave 48 hours prior to surgery.  Men may shave face and neck.  Do not bring valuables to the hospital.  [facility] is not responsible for any belongings  or valuables.  Contacts, dentures or bridgework may not be worn into surgery.  Leave your suitcase in the car.  After surgery it may be brought to your room.  For patients admitted to the hospital, discharge time will be determined by your treatment team.  Patients discharged the day of surgery will not be allowed to drive home.    Special instructions:    - Preparing For Surgery  Before surgery, you can play an important role. Because skin is not sterile, your skin needs to be as free of germs as possible. You can reduce the number of germs on your skin by washing with CHG (chlorahexidine gluconate) Soap before surgery.  CHG is an  antiseptic cleaner which kills germs and bonds with the skin to continue killing germs even after washing.  Please do not use if you have an allergy to CHG or antibacterial soaps. If your skin becomes reddened/irritated stop using the CHG.  Do not shave (including legs and underarms) for at least 48 hours prior to first CHG shower. It is OK to shave your face.  Please follow these instructions carefully.   1. Shower the NIGHT BEFORE SURGERY and the MORNING OF SURGERY with CHG.   2. If you chose to wash your hair, wash your hair first as usual with your normal shampoo.  3. After you shampoo, rinse your hair and body thoroughly to remove the shampoo.  4. Use CHG as you would any other liquid soap. You can apply CHG directly to the skin and wash gently with a scrungie or a clean washcloth.   5. Apply the CHG Soap to your body ONLY FROM THE NECK DOWN.  Do not use on open wounds or open sores. Avoid contact with your eyes, ears, mouth and genitals (private parts). Wash genitals (private parts) with your normal soap.  6. Wash thoroughly, paying special attention to the area where your surgery will be performed.  7. Thoroughly rinse your body with warm water from the neck down.  8. DO NOT shower/wash with your normal soap after using and rinsing off the CHG Soap.  9. Pat yourself dry with a CLEAN TOWEL.   10. Wear CLEAN PAJAMAS   11. Place CLEAN SHEETS on your bed the night of your first shower and DO NOT SLEEP WITH PETS.    Day of Surgery: Do not apply any deodorants/lotions. Please wear clean clothes to the hospital/surgery center.      Please read over the following fact sheets that you were given. MRSA Information

## 2016-10-16 ENCOUNTER — Encounter (HOSPITAL_COMMUNITY): Payer: Self-pay

## 2016-10-16 ENCOUNTER — Encounter (HOSPITAL_COMMUNITY)
Admission: RE | Admit: 2016-10-16 | Discharge: 2016-10-16 | Disposition: A | Discharge status: Home / Self Care | Payer: Medicare Other | Source: Ambulatory Visit | Attending: Neurosurgery | Admitting: Neurosurgery

## 2016-10-16 DIAGNOSIS — E785 Hyperlipidemia, unspecified: Secondary | ICD-10-CM | POA: Diagnosis not present

## 2016-10-16 DIAGNOSIS — K219 Gastro-esophageal reflux disease without esophagitis: Secondary | ICD-10-CM | POA: Diagnosis not present

## 2016-10-16 DIAGNOSIS — E538 Deficiency of other specified B group vitamins: Secondary | ICD-10-CM | POA: Diagnosis not present

## 2016-10-16 DIAGNOSIS — M5116 Intervertebral disc disorders with radiculopathy, lumbar region: Secondary | ICD-10-CM | POA: Diagnosis present

## 2016-10-16 DIAGNOSIS — Z7982 Long term (current) use of aspirin: Secondary | ICD-10-CM | POA: Diagnosis not present

## 2016-10-16 DIAGNOSIS — N189 Chronic kidney disease, unspecified: Secondary | ICD-10-CM | POA: Diagnosis not present

## 2016-10-16 DIAGNOSIS — Z882 Allergy status to sulfonamides status: Secondary | ICD-10-CM | POA: Diagnosis not present

## 2016-10-16 DIAGNOSIS — Z7984 Long term (current) use of oral hypoglycemic drugs: Secondary | ICD-10-CM | POA: Diagnosis not present

## 2016-10-16 DIAGNOSIS — M48061 Spinal stenosis, lumbar region without neurogenic claudication: Secondary | ICD-10-CM | POA: Diagnosis not present

## 2016-10-16 DIAGNOSIS — E1122 Type 2 diabetes mellitus with diabetic chronic kidney disease: Secondary | ICD-10-CM | POA: Diagnosis not present

## 2016-10-16 DIAGNOSIS — R001 Bradycardia, unspecified: Secondary | ICD-10-CM | POA: Diagnosis not present

## 2016-10-16 DIAGNOSIS — Z79899 Other long term (current) drug therapy: Secondary | ICD-10-CM | POA: Diagnosis not present

## 2016-10-16 DIAGNOSIS — N529 Male erectile dysfunction, unspecified: Secondary | ICD-10-CM | POA: Diagnosis not present

## 2016-10-16 DIAGNOSIS — I129 Hypertensive chronic kidney disease with stage 1 through stage 4 chronic kidney disease, or unspecified chronic kidney disease: Secondary | ICD-10-CM | POA: Diagnosis not present

## 2016-10-16 HISTORY — DX: Bradycardia, unspecified: R00.1

## 2016-10-16 HISTORY — DX: Ventricular premature depolarization: I49.3

## 2016-10-16 LAB — BASIC METABOLIC PANEL
Anion gap: 10 (ref 5–15)
BUN: 23 mg/dL — AB (ref 6–20)
CHLORIDE: 105 mmol/L (ref 101–111)
CO2: 24 mmol/L (ref 22–32)
Calcium: 9.3 mg/dL (ref 8.9–10.3)
Creatinine, Ser: 1.31 mg/dL — ABNORMAL HIGH (ref 0.61–1.24)
GFR calc Af Amer: 58 mL/min — ABNORMAL LOW (ref >60)
GFR calc non Af Amer: 50 mL/min — ABNORMAL LOW (ref >60)
GLUCOSE: 117 mg/dL — AB (ref 65–99)
Potassium: 5.1 mmol/L (ref 3.5–5.1)
SODIUM: 139 mmol/L (ref 135–145)

## 2016-10-16 LAB — CBC
HCT: 40.9 % (ref 39.0–52.0)
HEMOGLOBIN: 13.4 g/dL (ref 13.0–17.0)
MCH: 32.1 pg (ref 26.0–34.0)
MCHC: 32.8 g/dL (ref 30.0–36.0)
MCV: 98.1 fL (ref 78.0–100.0)
Platelets: 207 10*3/uL (ref 150–400)
RBC: 4.17 MIL/uL — AB (ref 4.22–5.81)
RDW: 12.4 % (ref 11.5–15.5)
WBC: 6.7 10*3/uL (ref 4.0–10.5)

## 2016-10-16 LAB — SURGICAL PCR SCREEN
MRSA, PCR: NEGATIVE
Staphylococcus aureus: NEGATIVE

## 2016-10-16 LAB — GLUCOSE, CAPILLARY: GLUCOSE-CAPILLARY: 134 mg/dL — AB (ref 65–99)

## 2016-10-16 LAB — HEMOGLOBIN A1C
Hgb A1c MFr Bld: 6.9 % — ABNORMAL HIGH (ref 4.8–5.6)
Mean Plasma Glucose: 151.33 mg/dL

## 2016-10-16 MED ORDER — CHLORHEXIDINE GLUCONATE CLOTH 2 % EX PADS: 6.0000 | MEDICATED_PAD | Freq: Once | CUTANEOUS | Status: DC

## 2016-10-16 NOTE — Progress Notes (Addendum)
PCP - Leim Fabry Cardiologist - Dr. Lady Gary, recently seen d/t "low heart rate". Per patient everything checked out OK.   EKG - 08/08/16, requested  ECHO - 09/14/16  Patient reports sleep apnea but has not used CPAP in over 15 years.  Fasting Blood Sugar - pt reports CBG is between 110-130 usually, last A1c 7.0 in April, will redraw today Pt states he occasionally has had high potassium, but this depends on what he eats.   Patient denies shortness of breath, fever, cough and chest pain at PAT appointment   Patient verbalized understanding of instructions that were given to them at the PAT appointment. Patient was also instructed that they will need to review over the PAT instructions again at home before surgery.

## 2016-10-17 NOTE — Progress Notes (Signed)
Anesthesia Chart Review:  Pt is a 81 year old male scheduled for left L3-4, L4-5 laminectomy and foraminotomy on 10/18/2016 with Tressie Stalker, M.D.  - PCP is Leim Fabry, M.D. (notes in care everywhere)  - Cardiologist is Harold Hedge, MD who sees pt for palpitations/bradycardia; last office visit 09/21/16 (notes in care everywhere).  PMH includes: HTN, DM, hyperlipidemia, PVCs, CK D, OSA, anemia, B12 deficiency, GERD. Never smoker. BMI 29.  Medications include: ASA 81 mg, glipizide, lisinopril-HCTZ, lovastatin, metformin, B12  BP 112/62   Pulse 64   Temp 36.7 C   Resp 20   Ht 5\' 8"  (1.727 m)   Wt 190 lb 11.2 oz (86.5 kg)   SpO2 100%   BMI 29.00 kg/m   Preoperative labs reviewed.  HbA1c 6.9, glucose 117.  EKG 08/08/16, by report (tracing requested from [facility] at [facility]): - Wandering pacemaker. Multiple PVCs. RSR' in V1 or V2. Nonspecific T wave abnormalities. - If EKG tracing does not arrive by DOS, EKG will be obtained on arrival  Echo 09/14/16 (care everywhere):  1. Normal LV systolic function with mild LVH. EF >55% 2. Normal RV systolic function. 3. Mild MR, mild TR, mild PR. 4. No valvular stenosis.  Holter monitor 08/26/16 (care everywhere): 1. Sinus rhythm with frequent pvcs with couplets and triplets. No prolonged runs. No diary returned.   If no changes, I anticipate pt can proceed with surgery as scheduled.   Rica Mast, FNP-BC [facility]/Anesthesiology Phone: [phone] 10/17/2016 10:15 AM

## 2016-10-18 ENCOUNTER — Ambulatory Visit (HOSPITAL_COMMUNITY): Payer: Medicare Other | Admitting: Emergency Medicine

## 2016-10-18 ENCOUNTER — Encounter (HOSPITAL_COMMUNITY)
Admission: RE | Disposition: A | Discharge status: Home / Self Care | Payer: Self-pay | Source: Ambulatory Visit | Attending: Neurosurgery

## 2016-10-18 ENCOUNTER — Ambulatory Visit (HOSPITAL_COMMUNITY): Payer: Medicare Other | Admitting: Certified Registered Nurse Anesthetist

## 2016-10-18 ENCOUNTER — Ambulatory Visit (HOSPITAL_COMMUNITY): Payer: Medicare Other

## 2016-10-18 ENCOUNTER — Encounter (HOSPITAL_COMMUNITY): Payer: Self-pay | Admitting: *Deleted

## 2016-10-18 ENCOUNTER — Ambulatory Visit (HOSPITAL_COMMUNITY)
Admission: RE | Admit: 2016-10-18 | Discharge: 2016-10-18 | Disposition: A | Discharge status: Home / Self Care | Payer: Medicare Other | Source: Ambulatory Visit | Attending: Neurosurgery | Admitting: Neurosurgery

## 2016-10-18 DIAGNOSIS — Z79899 Other long term (current) drug therapy: Secondary | ICD-10-CM | POA: Insufficient documentation

## 2016-10-18 DIAGNOSIS — Z882 Allergy status to sulfonamides status: Secondary | ICD-10-CM | POA: Insufficient documentation

## 2016-10-18 DIAGNOSIS — K219 Gastro-esophageal reflux disease without esophagitis: Secondary | ICD-10-CM | POA: Insufficient documentation

## 2016-10-18 DIAGNOSIS — I129 Hypertensive chronic kidney disease with stage 1 through stage 4 chronic kidney disease, or unspecified chronic kidney disease: Secondary | ICD-10-CM | POA: Insufficient documentation

## 2016-10-18 DIAGNOSIS — R001 Bradycardia, unspecified: Secondary | ICD-10-CM | POA: Insufficient documentation

## 2016-10-18 DIAGNOSIS — M48061 Spinal stenosis, lumbar region without neurogenic claudication: Secondary | ICD-10-CM | POA: Diagnosis not present

## 2016-10-18 DIAGNOSIS — E538 Deficiency of other specified B group vitamins: Secondary | ICD-10-CM | POA: Insufficient documentation

## 2016-10-18 DIAGNOSIS — E1122 Type 2 diabetes mellitus with diabetic chronic kidney disease: Secondary | ICD-10-CM | POA: Insufficient documentation

## 2016-10-18 DIAGNOSIS — E785 Hyperlipidemia, unspecified: Secondary | ICD-10-CM | POA: Insufficient documentation

## 2016-10-18 DIAGNOSIS — M5126 Other intervertebral disc displacement, lumbar region: Secondary | ICD-10-CM | POA: Diagnosis present

## 2016-10-18 DIAGNOSIS — Z7984 Long term (current) use of oral hypoglycemic drugs: Secondary | ICD-10-CM | POA: Insufficient documentation

## 2016-10-18 DIAGNOSIS — Z7982 Long term (current) use of aspirin: Secondary | ICD-10-CM | POA: Insufficient documentation

## 2016-10-18 DIAGNOSIS — M5116 Intervertebral disc disorders with radiculopathy, lumbar region: Secondary | ICD-10-CM | POA: Diagnosis not present

## 2016-10-18 DIAGNOSIS — N529 Male erectile dysfunction, unspecified: Secondary | ICD-10-CM | POA: Insufficient documentation

## 2016-10-18 DIAGNOSIS — N189 Chronic kidney disease, unspecified: Secondary | ICD-10-CM | POA: Insufficient documentation

## 2016-10-18 DIAGNOSIS — Z419 Encounter for procedure for purposes other than remedying health state, unspecified: Secondary | ICD-10-CM

## 2016-10-18 HISTORY — PX: LUMBAR LAMINECTOMY/DECOMPRESSION MICRODISCECTOMY: SHX5026

## 2016-10-18 HISTORY — PX: BACK SURGERY: SHX140

## 2016-10-18 LAB — GLUCOSE, CAPILLARY
GLUCOSE-CAPILLARY: 256 mg/dL — AB (ref 65–99)
Glucose-Capillary: 138 mg/dL — ABNORMAL HIGH (ref 65–99)

## 2016-10-18 SURGERY — LUMBAR LAMINECTOMY/DECOMPRESSION MICRODISCECTOMY 2 LEVELS
Anesthesia: General | Site: Spine Lumbar | Laterality: Left

## 2016-10-18 MED ORDER — DOCUSATE SODIUM 100 MG PO CAPS: 100.0000 mg | ORAL_CAPSULE | Freq: Two times a day (BID) | ORAL | Status: DC

## 2016-10-18 MED ORDER — ROCURONIUM BROMIDE 10 MG/ML (PF) SYRINGE
PREFILLED_SYRINGE | INTRAVENOUS | Status: DC | PRN
  Administered 2016-10-18: 70 mg via INTRAVENOUS
  Administered 2016-10-18: 10 mg via INTRAVENOUS

## 2016-10-18 MED ORDER — LIDOCAINE 2% (20 MG/ML) 5 ML SYRINGE
INTRAMUSCULAR | Status: DC | PRN
  Administered 2016-10-18: 100 mg via INTRAVENOUS

## 2016-10-18 MED ORDER — PROPOFOL 10 MG/ML IV BOLUS
INTRAVENOUS | Status: AC
  Filled 2016-10-18: qty 20

## 2016-10-18 MED ORDER — BUPIVACAINE-EPINEPHRINE (PF) 0.5% -1:200000 IJ SOLN
INTRAMUSCULAR | Status: DC | PRN
  Administered 2016-10-18 (×2): 10 mL

## 2016-10-18 MED ORDER — ACETAMINOPHEN 325 MG PO TABS: 650.0000 mg | ORAL_TABLET | ORAL | Status: DC | PRN

## 2016-10-18 MED ORDER — DEXAMETHASONE SODIUM PHOSPHATE 10 MG/ML IJ SOLN
INTRAMUSCULAR | Status: AC
  Filled 2016-10-18: qty 1

## 2016-10-18 MED ORDER — ONDANSETRON HCL 4 MG/2ML IJ SOLN: 4.0000 mg | Freq: Four times a day (QID) | INTRAMUSCULAR | Status: DC | PRN

## 2016-10-18 MED ORDER — THROMBIN 5000 UNITS EX SOLR
CUTANEOUS | Status: DC | PRN
Start: 2016-10-18 — End: 2016-10-18
  Administered 2016-10-18 (×2): 5000 [IU] via TOPICAL

## 2016-10-18 MED ORDER — BACITRACIN ZINC 500 UNIT/GM EX OINT
TOPICAL_OINTMENT | CUTANEOUS | Status: DC | PRN
  Administered 2016-10-18: 1 via TOPICAL

## 2016-10-18 MED ORDER — CYCLOBENZAPRINE HCL 5 MG PO TABS: 5.0000 mg | ORAL_TABLET | Freq: Two times a day (BID) | ORAL | Status: DC

## 2016-10-18 MED ORDER — CYCLOBENZAPRINE HCL 10 MG PO TABS: 10.0000 mg | ORAL_TABLET | Freq: Three times a day (TID) | ORAL | Status: DC | PRN

## 2016-10-18 MED ORDER — GABAPENTIN 300 MG PO CAPS: 300.0000 mg | ORAL_CAPSULE | Freq: Every day | ORAL | Status: DC

## 2016-10-18 MED ORDER — LACTATED RINGERS IV SOLN
INTRAVENOUS | Status: DC | PRN
  Administered 2016-10-18 (×2): via INTRAVENOUS

## 2016-10-18 MED ORDER — HYDROCHLOROTHIAZIDE 25 MG PO TABS: 25.0000 mg | ORAL_TABLET | Freq: Every day | ORAL | Status: DC

## 2016-10-18 MED ORDER — PROMETHAZINE HCL 25 MG/ML IJ SOLN: 6.2500 mg | INTRAMUSCULAR | Status: DC | PRN

## 2016-10-18 MED ORDER — DOCUSATE SODIUM 100 MG PO CAPS: 100.0000 mg | ORAL_CAPSULE | Freq: Two times a day (BID) | ORAL | 0 refills | Status: DC

## 2016-10-18 MED ORDER — FENTANYL CITRATE (PF) 250 MCG/5ML IJ SOLN
INTRAMUSCULAR | Status: AC
  Filled 2016-10-18: qty 5

## 2016-10-18 MED ORDER — METFORMIN HCL ER 500 MG PO TB24: 500.0000 mg | ORAL_TABLET | Freq: Two times a day (BID) | ORAL | Status: DC

## 2016-10-18 MED ORDER — HYDROMORPHONE HCL 1 MG/ML IJ SOLN: 0.2500 mg | INTRAMUSCULAR | Status: DC | PRN

## 2016-10-18 MED ORDER — THROMBIN 5000 UNITS EX SOLR
CUTANEOUS | Status: AC
  Filled 2016-10-18: qty 15000

## 2016-10-18 MED ORDER — BUPIVACAINE-EPINEPHRINE (PF) 0.5% -1:200000 IJ SOLN
INTRAMUSCULAR | Status: AC
  Filled 2016-10-18: qty 30

## 2016-10-18 MED ORDER — BISACODYL 10 MG RE SUPP: 10.0000 mg | Freq: Every day | RECTAL | Status: DC | PRN

## 2016-10-18 MED ORDER — BACITRACIN ZINC 500 UNIT/GM EX OINT
TOPICAL_OINTMENT | CUTANEOUS | Status: AC
  Filled 2016-10-18: qty 28.35

## 2016-10-18 MED ORDER — LIDOCAINE 2% (20 MG/ML) 5 ML SYRINGE
INTRAMUSCULAR | Status: AC
  Filled 2016-10-18: qty 5

## 2016-10-18 MED ORDER — PRAVASTATIN SODIUM 40 MG PO TABS: 40.0000 mg | ORAL_TABLET | Freq: Every day | ORAL | Status: DC

## 2016-10-18 MED ORDER — DEXTROSE 5 % IV SOLN
INTRAVENOUS | Status: DC | PRN
  Administered 2016-10-18: 25 ug/min via INTRAVENOUS

## 2016-10-18 MED ORDER — INSULIN ASPART 100 UNIT/ML ~~LOC~~ SOLN: 0.0000 [IU] | SUBCUTANEOUS | Status: DC

## 2016-10-18 MED ORDER — LISINOPRIL 20 MG PO TABS: 10.0000 mg | ORAL_TABLET | Freq: Every day | ORAL | Status: DC

## 2016-10-18 MED ORDER — SODIUM CHLORIDE 0.9% FLUSH: 3.0000 mL | Freq: Two times a day (BID) | INTRAVENOUS | Status: DC

## 2016-10-18 MED ORDER — CYCLOBENZAPRINE HCL 5 MG PO TABS: 5.0000 mg | ORAL_TABLET | Freq: Two times a day (BID) | ORAL | 0 refills | Status: DC

## 2016-10-18 MED ORDER — LACTATED RINGERS IV SOLN
INTRAVENOUS | Status: DC
  Administered 2016-10-18: 10:00:00 via INTRAVENOUS

## 2016-10-18 MED ORDER — ONDANSETRON HCL 4 MG PO TABS: 4.0000 mg | ORAL_TABLET | Freq: Four times a day (QID) | ORAL | Status: DC | PRN

## 2016-10-18 MED ORDER — BACITRACIN 50000 UNITS IM SOLR
INTRAMUSCULAR | Status: DC | PRN
  Administered 2016-10-18: 500 mL

## 2016-10-18 MED ORDER — PHENYLEPHRINE 40 MCG/ML (10ML) SYRINGE FOR IV PUSH (FOR BLOOD PRESSURE SUPPORT)
PREFILLED_SYRINGE | INTRAVENOUS | Status: AC
  Filled 2016-10-18: qty 10

## 2016-10-18 MED ORDER — OXYCODONE HCL 5 MG PO TABS
5.0000 mg | ORAL_TABLET | ORAL | Status: DC | PRN
  Administered 2016-10-18: 5 mg via ORAL
  Filled 2016-10-18: qty 1

## 2016-10-18 MED ORDER — MENTHOL 3 MG MT LOZG
1.0000 | LOZENGE | OROMUCOSAL | Status: DC | PRN
  Filled 2016-10-18: qty 9

## 2016-10-18 MED ORDER — ONDANSETRON HCL 4 MG/2ML IJ SOLN
INTRAMUSCULAR | Status: DC | PRN
  Administered 2016-10-18: 4 mg via INTRAVENOUS

## 2016-10-18 MED ORDER — MORPHINE SULFATE (PF) 4 MG/ML IV SOLN: 2.0000 mg | INTRAVENOUS | Status: DC | PRN

## 2016-10-18 MED ORDER — 0.9 % SODIUM CHLORIDE (POUR BTL) OPTIME
TOPICAL | Status: DC | PRN
  Administered 2016-10-18: 1000 mL

## 2016-10-18 MED ORDER — LISINOPRIL-HYDROCHLOROTHIAZIDE 10-12.5 MG PO TABS: 1.0000 | ORAL_TABLET | Freq: Every day | ORAL | Status: DC

## 2016-10-18 MED ORDER — INSULIN ASPART 100 UNIT/ML ~~LOC~~ SOLN: 0.0000 [IU] | Freq: Three times a day (TID) | SUBCUTANEOUS | Status: DC

## 2016-10-18 MED ORDER — SODIUM CHLORIDE 0.9 % IV SOLN: 250.0000 mL | INTRAVENOUS | Status: DC

## 2016-10-18 MED ORDER — FENTANYL CITRATE (PF) 100 MCG/2ML IJ SOLN
INTRAMUSCULAR | Status: DC | PRN
  Administered 2016-10-18: 50 ug via INTRAVENOUS
  Administered 2016-10-18: 100 ug via INTRAVENOUS

## 2016-10-18 MED ORDER — ACETAMINOPHEN 650 MG RE SUPP: 650.0000 mg | RECTAL | Status: DC | PRN

## 2016-10-18 MED ORDER — SUGAMMADEX SODIUM 200 MG/2ML IV SOLN
INTRAVENOUS | Status: AC
  Filled 2016-10-18: qty 2

## 2016-10-18 MED ORDER — CEFAZOLIN SODIUM-DEXTROSE 2-4 GM/100ML-% IV SOLN
2.0000 g | INTRAVENOUS | Status: AC
  Administered 2016-10-18: 2 g via INTRAVENOUS

## 2016-10-18 MED ORDER — ROCURONIUM BROMIDE 10 MG/ML (PF) SYRINGE
PREFILLED_SYRINGE | INTRAVENOUS | Status: AC
  Filled 2016-10-18: qty 5

## 2016-10-18 MED ORDER — GELATIN ABSORBABLE MT POWD
OROMUCOSAL | Status: DC | PRN
  Administered 2016-10-18: 12:00:00 via TOPICAL

## 2016-10-18 MED ORDER — CEFAZOLIN SODIUM-DEXTROSE 2-4 GM/100ML-% IV SOLN: 2.0000 g | Freq: Three times a day (TID) | INTRAVENOUS | Status: DC

## 2016-10-18 MED ORDER — SODIUM CHLORIDE 0.9% FLUSH: 3.0000 mL | INTRAVENOUS | Status: DC | PRN

## 2016-10-18 MED ORDER — PHENOL 1.4 % MT LIQD
1.0000 | OROMUCOSAL | Status: DC | PRN
  Administered 2016-10-18: 1 via OROMUCOSAL
  Filled 2016-10-18: qty 177

## 2016-10-18 MED ORDER — ONDANSETRON HCL 4 MG/2ML IJ SOLN
INTRAMUSCULAR | Status: AC
  Filled 2016-10-18: qty 2

## 2016-10-18 MED ORDER — GLIPIZIDE ER 2.5 MG PO TB24
2.5000 mg | ORAL_TABLET | Freq: Two times a day (BID) | ORAL | Status: DC
  Filled 2016-10-18 (×2): qty 1

## 2016-10-18 MED ORDER — DEXAMETHASONE SODIUM PHOSPHATE 10 MG/ML IJ SOLN
INTRAMUSCULAR | Status: DC | PRN
  Administered 2016-10-18: 10 mg via INTRAVENOUS

## 2016-10-18 MED ORDER — HEMOSTATIC AGENTS (NO CHARGE) OPTIME
TOPICAL | Status: DC | PRN
  Administered 2016-10-18: 1 via TOPICAL

## 2016-10-18 MED ORDER — SUGAMMADEX SODIUM 200 MG/2ML IV SOLN
INTRAVENOUS | Status: DC | PRN
  Administered 2016-10-18: 200 mg via INTRAVENOUS

## 2016-10-18 MED ORDER — OXYCODONE HCL 5 MG PO TABS: 5.0000 mg | ORAL_TABLET | ORAL | 0 refills | Status: DC | PRN

## 2016-10-18 MED ORDER — PROPOFOL 10 MG/ML IV BOLUS
INTRAVENOUS | Status: DC | PRN
  Administered 2016-10-18: 160 mg via INTRAVENOUS

## 2016-10-18 SURGICAL SUPPLY — 58 items
BAG DECANTER FOR FLEXI CONT (MISCELLANEOUS) ×3 IMPLANT
BENZOIN TINCTURE PRP APPL 2/3 (GAUZE/BANDAGES/DRESSINGS) ×3 IMPLANT
BLADE CLIPPER SURG (BLADE) IMPLANT
BUR MATCHSTICK NEURO 3.0 LAGG (BURR) ×3 IMPLANT
BUR PRECISION FLUTE 6.0 (BURR) ×3 IMPLANT
CANISTER SUCT 3000ML PPV (MISCELLANEOUS) ×3 IMPLANT
CARTRIDGE OIL MAESTRO DRILL (MISCELLANEOUS) ×1 IMPLANT
CLOSURE STERI-STRIP 1/2X4 (GAUZE/BANDAGES/DRESSINGS) ×1
CLOSURE WOUND 1/2 X4 (GAUZE/BANDAGES/DRESSINGS) ×1
CLSR STERI-STRIP ANTIMIC 1/2X4 (GAUZE/BANDAGES/DRESSINGS) ×2 IMPLANT
DIFFUSER DRILL AIR PNEUMATIC (MISCELLANEOUS) ×3 IMPLANT
DRAPE LAPAROTOMY 100X72X124 (DRAPES) ×3 IMPLANT
DRAPE MICROSCOPE LEICA (MISCELLANEOUS) ×3 IMPLANT
DRAPE POUCH INSTRU U-SHP 10X18 (DRAPES) ×3 IMPLANT
DRAPE SURG 17X23 STRL (DRAPES) ×12 IMPLANT
ELECT BLADE 4.0 EZ CLEAN MEGAD (MISCELLANEOUS) ×3
ELECT REM PT RETURN 9FT ADLT (ELECTROSURGICAL) ×3
ELECTRODE BLDE 4.0 EZ CLN MEGD (MISCELLANEOUS) ×1 IMPLANT
ELECTRODE REM PT RTRN 9FT ADLT (ELECTROSURGICAL) ×1 IMPLANT
GAUZE SPONGE 4X4 12PLY STRL (GAUZE/BANDAGES/DRESSINGS) IMPLANT
GAUZE SPONGE 4X4 12PLY STRL LF (GAUZE/BANDAGES/DRESSINGS) ×3 IMPLANT
GAUZE SPONGE 4X4 16PLY XRAY LF (GAUZE/BANDAGES/DRESSINGS) IMPLANT
GLOVE BIO SURGEON STRL SZ8 (GLOVE) ×6 IMPLANT
GLOVE BIO SURGEON STRL SZ8.5 (GLOVE) ×6 IMPLANT
GLOVE BIOGEL PI IND STRL 7.0 (GLOVE) ×2 IMPLANT
GLOVE BIOGEL PI IND STRL 7.5 (GLOVE) ×3 IMPLANT
GLOVE BIOGEL PI INDICATOR 7.0 (GLOVE) ×4
GLOVE BIOGEL PI INDICATOR 7.5 (GLOVE) ×6
GLOVE ECLIPSE 7.5 STRL STRAW (GLOVE) ×6 IMPLANT
GLOVE EXAM NITRILE LRG STRL (GLOVE) IMPLANT
GLOVE EXAM NITRILE XL STR (GLOVE) IMPLANT
GLOVE EXAM NITRILE XS STR PU (GLOVE) IMPLANT
GLOVE SURG SS PI 7.5 STRL IVOR (GLOVE) ×3 IMPLANT
GOWN STRL REUS W/ TWL LRG LVL3 (GOWN DISPOSABLE) ×1 IMPLANT
GOWN STRL REUS W/ TWL XL LVL3 (GOWN DISPOSABLE) ×2 IMPLANT
GOWN STRL REUS W/TWL 2XL LVL3 (GOWN DISPOSABLE) IMPLANT
GOWN STRL REUS W/TWL LRG LVL3 (GOWN DISPOSABLE) ×2
GOWN STRL REUS W/TWL XL LVL3 (GOWN DISPOSABLE) ×4
HEMOSTAT POWDER KIT SURGIFOAM (HEMOSTASIS) ×3 IMPLANT
KIT BASIN OR (CUSTOM PROCEDURE TRAY) ×3 IMPLANT
KIT ROOM TURNOVER OR (KITS) ×3 IMPLANT
NEEDLE HYPO 21X1.5 SAFETY (NEEDLE) IMPLANT
NEEDLE HYPO 22GX1.5 SAFETY (NEEDLE) ×3 IMPLANT
NS IRRIG 1000ML POUR BTL (IV SOLUTION) ×3 IMPLANT
OIL CARTRIDGE MAESTRO DRILL (MISCELLANEOUS) ×3
PACK LAMINECTOMY NEURO (CUSTOM PROCEDURE TRAY) ×3 IMPLANT
PAD ARMBOARD 7.5X6 YLW CONV (MISCELLANEOUS) ×15 IMPLANT
PATTIES SURGICAL .5 X1 (DISPOSABLE) IMPLANT
RUBBERBAND STERILE (MISCELLANEOUS) ×6 IMPLANT
SPONGE SURGIFOAM ABS GEL SZ50 (HEMOSTASIS) ×3 IMPLANT
STRIP CLOSURE SKIN 1/2X4 (GAUZE/BANDAGES/DRESSINGS) ×2 IMPLANT
SUT VIC AB 1 CT1 18XBRD ANBCTR (SUTURE) ×2 IMPLANT
SUT VIC AB 1 CT1 8-18 (SUTURE) ×4
SUT VIC AB 2-0 CP2 18 (SUTURE) ×6 IMPLANT
TAPE CLOTH SURG 4X10 WHT LF (GAUZE/BANDAGES/DRESSINGS) ×3 IMPLANT
TOWEL GREEN STERILE (TOWEL DISPOSABLE) ×3 IMPLANT
TOWEL GREEN STERILE FF (TOWEL DISPOSABLE) ×3 IMPLANT
WATER STERILE IRR 1000ML POUR (IV SOLUTION) ×3 IMPLANT

## 2016-10-18 NOTE — Anesthesia Procedure Notes (Signed)
Procedure Name: Intubation Date/Time: 10/18/2016 10:52 AM Performed by: Annabelle Harman A Pre-anesthesia Checklist: Patient identified, Emergency Drugs available, Suction available and Patient being monitored Patient Re-evaluated:Patient Re-evaluated prior to induction Oxygen Delivery Method: Circle system utilized Preoxygenation: Pre-oxygenation with 100% oxygen Induction Type: IV induction Ventilation: Mask ventilation without difficulty Laryngoscope Size: Miller and 2 Grade View: Grade II Tube type: Oral Tube size: 8.0 mm Number of attempts: 1 Airway Equipment and Method: Stylet Placement Confirmation: ETT inserted through vocal cords under direct vision,  positive ETCO2 and breath sounds checked- equal and bilateral Secured at: 24 cm Tube secured with: Tape Dental Injury: Teeth and Oropharynx as per pre-operative assessment

## 2016-10-18 NOTE — Anesthesia Preprocedure Evaluation (Signed)
Anesthesia Evaluation  Patient identified by MRN, date of birth, ID band Patient awake    Reviewed: Allergy & Precautions, NPO status , Patient's Chart, lab work & pertinent test results  History of Anesthesia Complications Negative for: history of anesthetic complications  Airway Mallampati: II  TM Distance: >3 FB Neck ROM: Full    Dental  (+) Partial Lower, Poor Dentition   Pulmonary Sleep apnea: hx of OSA, lost 50lbs and no longer has OSA. , neg COPD,    breath sounds clear to auscultation- rhonchi (-) wheezing      Cardiovascular hypertension, Pt. on medications (-) CAD and (-) Past MI  Rhythm:Regular Rate:Normal - Systolic murmurs and - Diastolic murmurs    Neuro/Psych negative neurological ROS  negative psych ROS   GI/Hepatic GERD  ,  Endo/Other  diabetes, Type 2, Oral Hypoglycemic Agents  Renal/GU Renal disease (followed by nephrology, never had dialysis)     Musculoskeletal negative musculoskeletal ROS (+)   Abdominal (+) - obese,   Peds  Hematology  (+) anemia ,   Anesthesia Other Findings    Reproductive/Obstetrics                             Anesthesia Physical  Anesthesia Plan  ASA: III  Anesthesia Plan: General   Post-op Pain Management:    Induction: Intravenous  PONV Risk Score and Plan: 3 and Ondansetron, Dexamethasone and Diphenhydramine  Airway Management Planned: Oral ETT  Additional Equipment:   Intra-op Plan:   Post-operative Plan: Extubation in OR  Informed Consent:   Plan Discussed with: CRNA and Anesthesiologist  Anesthesia Plan Comments:         Anesthesia Quick Evaluation

## 2016-10-18 NOTE — Evaluation (Signed)
Physical Therapy Evaluation Patient Details Name: Jermaine White MRN: [id number] DOB: [birth year] Today's Date: 10/18/2016   History of Present Illness  Patient is a 81 yo male s/p spinal surgery  Clinical Impression  Patient seen for mobility assessment s/p spinal surgery. Mobilizing well. Educated patient on precautions, mobility expectations, safety and car transfers. No further acute PT needs. Will sign off.     Follow Up Recommendations No PT follow up    Equipment Recommendations  None recommended by PT    Recommendations for Other Services       Precautions / Restrictions Precautions Precautions: Back Precaution Booklet Issued: Yes (comment) Precaution Comments: verbally reviewed with patient      Mobility  Bed Mobility Overal bed mobility: Modified Independent             General bed mobility comments: increased time to perform, no physical assist or cues after initial instruction  Transfers Overall transfer level: Modified independent Equipment used: None             General transfer comment: increased time to power up  Ambulation/Gait Ambulation/Gait assistance: Independent Ambulation Distance (Feet): 310 Feet Assistive device: None Gait Pattern/deviations: Step-through pattern;Decreased stride length;Narrow base of support Gait velocity: modestly decreaseed   General Gait Details: steady with ambulation, no physical assist required  Stairs Stairs: Yes Stairs assistance: Supervision Stair Management: One rail Left Number of Stairs: 5 General stair comments: no physical assist required, supervision for safety  Wheelchair Mobility    Modified Rankin (Stroke Patients Only)       Balance Overall balance assessment: No apparent balance deficits (not formally assessed)                                           Pertinent Vitals/Pain Pain Assessment: Faces Faces Pain Scale: Hurts little more Pain Location: left  hip/back/buttocks Pain Descriptors / Indicators: Sore Pain Intervention(s): Monitored during session    Home Living Family/patient expects to be discharged to:: Private residence Living Arrangements: Spouse/significant other Available Help at Discharge: Family;Available 24 hours/day Type of Home: House Home Access: Stairs to enter Entrance Stairs-Rails: Can reach both Entrance Stairs-Number of Steps: 3 Home Layout: Able to live on main level with bedroom/bathroom Home Equipment: Cane - single point      Prior Function Level of Independence: Independent               Hand Dominance   Dominant Hand: Right    Extremity/Trunk Assessment   Upper Extremity Assessment Upper Extremity Assessment: Overall WFL for tasks assessed    Lower Extremity Assessment Lower Extremity Assessment: Overall WFL for tasks assessed       Communication   Communication: No difficulties  Cognition Arousal/Alertness: Awake/alert Behavior During Therapy: WFL for tasks assessed/performed Overall Cognitive Status: Within Functional Limits for tasks assessed                                        General Comments      Exercises     Assessment/Plan    PT Assessment Patent does not need any further PT services  PT Problem List         PT Treatment Interventions      PT Goals (Current goals can be found in the Care Plan section)  Acute Rehab PT Goals PT Goal Formulation: All assessment and education complete, DC therapy    Frequency     Barriers to discharge        Co-evaluation               AM-PAC PT "6 Clicks" Daily Activity  Outcome Measure Difficulty turning over in bed (including adjusting bedclothes, sheets and blankets)?: A Little Difficulty moving from lying on back to sitting on the side of the bed? : A Little Difficulty sitting down on and standing up from a chair with arms (e.g., wheelchair, bedside commode, etc,.)?: A Little Help needed  moving to and from a bed to chair (including a wheelchair)?: A Little Help needed walking in hospital room?: A Little Help needed climbing 3-5 steps with a railing? : A Little 6 Click Score: 18    End of Session   Activity Tolerance: Patient tolerated treatment well Patient left: in bed;with call bell/phone within reach;with family/visitor present Nurse Communication: Mobility status PT Visit Diagnosis: Difficulty in walking, not elsewhere classified (R26.2)    Time: [id number] PT Time Calculation (min) (ACUTE ONLY): 19 min   Charges:   PT Evaluation $PT Eval Low Complexity: 1 Low     PT G Codes:   PT G-Codes **NOT FOR INPATIENT CLASS** Functional Assessment Tool Used: Clinical judgement Functional Limitation: Mobility: Walking and moving around Mobility: Walking and Moving Around Current Status ([id number](G8978): At least 1 percent but less than 20 percent impaired, limited or restricted Mobility: Walking and Moving Around Goal Status [phone](G8979): At least 1 percent but less than 20 percent impaired, limited or restricted Mobility: Walking and Moving Around Discharge Status [phone](G8980): At least 1 percent but less than 20 percent impaired, limited or restricted    Charlotte Crumbevon Tayte Mcwherter, PT DPT  Board Certified Neurologic Specialist [phone]   Fabio AsaDevon J Peytyn Trine 10/18/2016, 4:32 PM

## 2016-10-18 NOTE — Discharge Instructions (Signed)

## 2016-10-18 NOTE — Op Note (Signed)
Brief history: The patient is a 81 year old white male who has complained of back, left buttock and left leg pain. He failed medical management. He was worked up with a lumbar myelo CT which demonstrated L3-4 and L4-5 stenosis and herniated disc. I discussed the situation with the patient and his family. He has weighed the risks, benefits, and alternatives to surgery and decided proceed with a left L3-4 and L4-5 laminotomy foraminotomy and discectomy.  Preoperative diagnosis: Left L3-4 and L4-5 spinal stenosis, herniated disc, lumbar radiculopathy, lumbago,  Postoperative diagnosis: The same  Procedure: Left L3-4 and L4-5 laminotomy/foraminotomy/Intervertebral discectomy using micro-dissection  Surgeon: Dr. Delma OfficerJeff Wylma Tatem  Asst.: None  Anesthesia: Gen. endotracheal  Estimated blood loss: 75 mL  Drains: None  Complications: None  Description of procedure: The patient was brought to the operating room by the anesthesia team. General endotracheal anesthesia was induced. The patient was turned to the prone position on the Wilson frame. The patient's lumbosacral region was then prepared with Betadine scrub and Betadine solution. Sterile drapes were applied.  I then injected the area to be incised with Marcaine with epinephrine solution. I then used a scalpel to make a linear midline incision over the L3-4 and L4-5 intervertebral disc space. I then used electrocautery to perform a left sided subperiosteal dissection exposing the spinous process and lamina of L3, L4 and L5. We obtained intraoperative radiograph to confirm our location. I then [facility] the [facility] for exposure.  We then brought the operative microscope into the field. Under its magnification and illumination we completed the microdissection. I used a high-speed drill to perform a laminotomy at L3-4 and L4-5 on the left. I then used a Kerrison punches to widen the laminotomy and removed the ligamentum flavum at L3-4 and  L4-5 on the left. We then used microdissection to free up the thecal sac and the left L4 and left L5 nerve root from the epidural tissue. I then used a Kerrison punch to perform a foraminotomy at about the left L4 and left L5 nerve root. We then using the nerve root retractor to gently retract the thecal sac and the left L4 nerve root medially. This exposed the intervertebral disc at L3-4. We identified the ruptured disc and remove it with the pituitary forceps. We inspected the intervertebral disc at L4-5. There is no herniations at this level.  I then palpated along the ventral surface of the thecal sac and along exit route of the left L4 and left L5 nerve root and noted that the neural structures were well decompressed. This completed the decompression.  We then obtained hemostasis using bipolar electrocautery. We irrigated the wound out with bacitracin solution. We then removed the retractor. We then reapproximated the patient's thoracolumbar fascia with interrupted #1 Vicryl suture. We then reapproximated the patient's subcutaneous tissue with interrupted 2-0 Vicryl suture. We then reapproximated patient's skin with Steri-Strips and benzoin. The was then coated with bacitracin ointment. The drapes were removed. The patient was subsequently returned to the supine position where they were extubated by the anesthesia team. The patient was then transported to the postanesthesia care unit in stable condition. All sponge instrument and needle counts were reportedly correct at the end of this case.

## 2016-10-18 NOTE — Progress Notes (Signed)
Pt doing well. Pt and wife given D/C instructions with Rx's, verbal understanding was provided. Pt's incision is clean and dry with no sign of infection. Pt's IV was removed prior to D/C. Pt D/C'd home via wheelchair @ 1850 per MD order. Pt is stable @ D/C and has no other needs at this time. Rema Fendt, RN

## 2016-10-18 NOTE — Discharge Summary (Signed)
Physician Discharge Summary  Patient ID: Jermaine LynnGeorge D White MRN: [id number] DOB/AGE: 79/07/1935 79 y.o.  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 The surgery went well.  The patient is doing well. He is contemplating going home later on this evening. I gave the patient and his wife oral and written discharge instructions. I answered all their questions.  Consults: Physical therapy Significant Diagnostic Studies: None Treatments: Left L3-4 and L4-5 laminotomy/foraminotomy/discectomy using microdissection Discharge Exam: Blood pressure 124/84, pulse 88, temperature (!) 97.5 F (36.4 C), resp. rate 18, height 5\' 8"  (1.727 m), weight 86.2 kg (190 lb), SpO2 99 %. The patient is alert and pleasant. His strength is grossly normal on his lower extremities. He looks well.  Disposition: Home   Allergies as of 10/18/2016      Reactions   Sulfa Antibiotics Other (See Comments)   UNSPECIFIED REACTION  "Tolerates sulfonylurea "      Medication List    STOP taking these medications   acetaminophen 500 MG tablet Commonly known as:  TYLENOL     TAKE these medications   ACCU-CHEK COMPACT PLUS test strip Generic drug:  glucose blood CHECK BLOOD SUGAR TWICE A DAY. (DX E11.29)   ACCU-CHEK COMPACT PLUS test strip Generic drug:  glucose blood CHECK BLOOD SUGAR TWICE A DAY. (DX E11.29)   aspirin 81 MG tablet Take 81 mg by mouth daily.   calcium carbonate 500 MG chewable tablet Commonly known as:  TUMS - dosed in mg elemental calcium Chew 1 tablet by mouth daily as needed for indigestion or heartburn.   cyclobenzaprine 5 MG tablet Commonly known as:  FLEXERIL Take 5 mg by  mouth 2 (two) times daily. What changed:  Another medication with the same name was added. Make sure you understand how and when to take each.   cyclobenzaprine 5 MG tablet Commonly known as:  FLEXERIL Take 1 tablet (5 mg total) by mouth 2 (two) times daily. What changed:  You were already taking a medication with the same name, and this prescription was added. Make sure you understand how and when to take each.   docusate sodium 100 MG capsule Commonly known as:  COLACE Take 1 capsule (100 mg total) by mouth 2 (two) times daily.   gabapentin 300 MG capsule Commonly known as:  NEURONTIN Take 300 mg by mouth daily.   glipiZIDE 5 MG 24 hr tablet Commonly known as:  GLUCOTROL XL Take 2.5 mg by mouth 2 (two) times daily. Takes 0.5 tablet at breakfast and 0.5 tablet with dinner   Glucosamine-Chondroitin-MSM 500-400-300 MG Tabs Take 1 tablet by mouth 2 (two) times daily.   lisinopril-hydrochlorothiazide 10-12.5 MG tablet Commonly known as:  PRINZIDE,ZESTORETIC Take 1 tablet by mouth daily.   lovastatin 40 MG tablet Commonly known as:  MEVACOR Take 40 mg by mouth at bedtime.   metFORMIN 500 MG 24 hr tablet Commonly known as:  GLUCOPHAGE-XR Take 500 mg by mouth 2 (two) times daily. Does take this 2 times daily   OVER THE COUNTER MEDICATION Take 1 tablet by mouth 2 (two) times daily. Juice Plus Garden Blend Vitamins   OVER THE COUNTER MEDICATION Take 1 tablet by mouth 2 (two) times daily. Juice Plus Orchard Blend Vitamins   oxyCODONE 5  MG immediate release tablet Commonly known as:  Oxy IR/ROXICODONE Take 1-2 tablets (5-10 mg total) by mouth every 4 (four) hours as needed for breakthrough pain.   vitamin B-12 1000 MCG tablet Commonly known as:  CYANOCOBALAMIN Take 1,000 mcg by mouth daily.            Discharge Care Instructions        Start     Ordered   10/18/16 0000  cyclobenzaprine (FLEXERIL) 5 MG tablet  2 times daily     10/18/16 1614   10/18/16 0000  docusate  sodium (COLACE) 100 MG capsule  2 times daily     10/18/16 1614   10/18/16 0000  oxyCODONE (OXY IR/ROXICODONE) 5 MG immediate release tablet  Every 4 hours PRN     10/18/16 1614       Signed: Azir Muzyka D 10/18/2016, 4:14 PM

## 2016-10-18 NOTE — Transfer of Care (Signed)
Immediate Anesthesia Transfer of Care Note  Patient: Jermaine White  Procedure(s) Performed: Procedure(s): LAMINECTOMY AND FORAMINOTOMY LEFT LUMBAR THREE- LUMBAR FOUR, LUMBAR FOUR- LUMBAR FIVE (Left)  Patient Location: PACU  Anesthesia Type:General  Level of Consciousness: awake, alert , oriented and patient cooperative  Airway & Oxygen Therapy: Patient Spontanous Breathing and Patient connected to nasal cannula oxygen  Post-op Assessment: Report given to RN and Post -op Vital signs reviewed and stable  Post vital signs: Reviewed and stable  Last Vitals:  Vitals:   10/18/16 0956 10/18/16 1252  BP: 134/88 (P) 109/81  Pulse: 84   Resp: 20   Temp: 36.7 C (!) (P) 36.4 C  SpO2: 99%     Last Pain:  Vitals:   10/18/16 0956  TempSrc: Oral  PainSc:       Patients Stated Pain Goal: 2 (10/18/16 0955)  Complications: No apparent anesthesia complications

## 2016-10-18 NOTE — Anesthesia Postprocedure Evaluation (Signed)
Anesthesia Post Note  Patient: Jermaine White  Procedure(s) Performed: Procedure(s) (LRB): LAMINECTOMY AND FORAMINOTOMY LEFT LUMBAR THREE- LUMBAR FOUR, LUMBAR FOUR- LUMBAR FIVE (Left)     Patient location during evaluation: PACU Anesthesia Type: General Level of consciousness: sedated Pain management: pain level controlled Vital Signs Assessment: post-procedure vital signs reviewed and stable Respiratory status: spontaneous breathing and respiratory function stable Cardiovascular status: stable Anesthetic complications: no    Last Vitals:  Vitals:   10/18/16 1320 10/18/16 1335  BP: 117/81 120/79  Pulse: 88 89  Resp: 18 17  Temp:    SpO2: 100% 100%    Last Pain:  Vitals:   10/18/16 1252  TempSrc:   PainSc: 0-No pain                 Jerald Villalona DANIEL

## 2016-10-18 NOTE — H&P (Signed)
Subjective: The patient is a 81 year old white male who has complained of back and left leg pain consistent with a lumbar radiculopathy. He has failed medical management and was worked up with a lumbar myelo CT. This demonstrated spinal stenosis and bulging disks at L3-4 and L4-5 on the left. I discussed the various treatment options with the patient including surgery. He has weighed the risks, benefits, and alternatives surgery and decided to proceed with a left L3-4 and L4-5 laminotomy foraminotomy and possible discectomy.   Past Medical History:  Diagnosis Date  . Anemia   . B12 deficiency   . Bradycardia   . Chronic kidney disease    chronic renal insufficency  . Diabetes mellitus without complication (HCC)   . GERD (gastroesophageal reflux disease)   . Hyperkalemia   . Hyperlipidemia   . Hypertension   . Impotence   . PVC's (premature ventricular contractions)   . Sleep apnea    no CPAP in 15 years    Past Surgical History:  Procedure Laterality Date  . COLONOSCOPY    . COLONOSCOPY WITH PROPOFOL N/A 01/25/2016   Procedure: COLONOSCOPY WITH PROPOFOL;  Surgeon: Christena Deem, MD;  Location[facility];  Service: Endoscopy;  Laterality: N/A;  . HERNIA REPAIR     x3    Allergies  Allergen Reactions  . Sulfa Antibiotics Other (See Comments)    UNSPECIFIED REACTION  "Tolerates sulfonylurea "     Social History  Substance Use Topics  . Smoking status: Never Smoker  . Smokeless tobacco: Never Used  . Alcohol use No    History reviewed. No pertinent family history. Prior to Admission medications   Medication Sig Start Date End Date Taking? Authorizing Provider  aspirin 81 MG tablet Take 81 mg by mouth daily.  08/29/07  Yes [provider]  calcium carbonate (TUMS - DOSED IN MG ELEMENTAL CALCIUM) 500 MG chewable tablet Chew 1 tablet by mouth daily as needed for indigestion or heartburn.   Yes [provider]  cyclobenzaprine (FLEXERIL) 5 MG tablet  Take 5 mg by mouth 2 (two) times daily. 09/27/16  Yes [provider]  gabapentin (NEURONTIN) 300 MG capsule Take 300 mg by mouth daily.  06/13/16  Yes [provider]  glipiZIDE (GLUCOTROL XL) 5 MG 24 hr tablet Take 2.5 mg by mouth 2 (two) times daily. Takes 0.5 tablet at breakfast and 0.5 tablet with dinner 03/16/16 03/16/17 Yes [provider]  Glucosamine-Chondroitin-MSM 500-400-300 MG TABS Take 1 tablet by mouth 2 (two) times daily.    Yes [provider]  lisinopril-hydrochlorothiazide (PRINZIDE,ZESTORETIC) 10-12.5 MG tablet Take 1 tablet by mouth daily.  03/05/16  Yes [provider]  lovastatin (MEVACOR) 40 MG tablet Take 40 mg by mouth at bedtime.  03/05/16  Yes [provider]  metFORMIN (GLUCOPHAGE-XR) 500 MG 24 hr tablet Take 500 mg by mouth 2 (two) times daily. Does take this 2 times daily   Yes [provider]  OVER THE COUNTER MEDICATION Take 1 tablet by mouth 2 (two) times daily. Juice Plus Garden Blend Vitamins   Yes [provider]  OVER THE COUNTER MEDICATION Take 1 tablet by mouth 2 (two) times daily. Juice Plus Orchard Blend Vitamins   Yes [provider]  vitamin B-12 (CYANOCOBALAMIN) 1000 MCG tablet Take 1,000 mcg by mouth daily.  03/16/16  Yes [provider]  ACCU-CHEK COMPACT PLUS test strip CHECK BLOOD SUGAR TWICE A DAY. (DX E11.29) 04/14/15   [provider]  acetaminophen (  TYLENOL) 500 MG tablet Take 1,000 mg by mouth every 8 (eight) hours as needed for mild pain.    [provider]  glucose blood (ACCU-CHEK COMPACT PLUS) test strip CHECK BLOOD SUGAR TWICE A DAY. (DX E11.29) 02/23/16   [provider]     Review of Systems  Positive ROS: As above  All other systems have been reviewed and were otherwise negative with the exception of those mentioned in the HPI and as above.  Objective: Vital signs in last 24 hours: Temp:  [98.1 F (36.7 C)] 98.1 F (36.7 C)  (08/29 0956) Pulse Rate:  [84] 84 (08/29 0956) Resp:  [20] 20 (08/29 0956) BP: (134)/(88) 134/88 (08/29 0956) SpO2:  [99 %] 99 % (08/29 0956) Weight:  [86.2 kg (190 lb)] 86.2 kg (190 lb) (08/29 0955)  General Appearance: Alert Head: Normocephalic, without obvious abnormality, atraumatic Eyes: PERRL, conjunctiva/corneas clear, EOM's intact,    Ears: Normal  Throat: Normal  Neck: Supple, Back: unremarkable Lungs: Clear to auscultation bilaterally, respirations unlabored Heart: Regular rate and rhythm, no murmur, rub or gallop Abdomen: Soft, non-tender Extremities: Extremities normal, atraumatic, no cyanosis or edema Skin: unremarkable  NEUROLOGIC:   Mental status: alert and oriented,Motor Exam - grossly normal Sensory Exam - grossly normal Reflexes:  Coordination - grossly normal Gait - grossly normal Balance - grossly normal Cranial Nerves: I: smell Not tested  II: visual acuity  OS: Normal  OD: Normal   II: visual fields Full to confrontation  II: pupils Equal, round, reactive to light  III,VII: ptosis None  III,IV,VI: extraocular muscles  Full ROM  V: mastication Normal  V: facial light touch sensation  Normal  V,VII: corneal reflex  Present  VII: facial muscle function - upper  Normal  VII: facial muscle function - lower Normal  VIII: hearing Not tested  IX: soft palate elevation  Normal  IX,X: gag reflex Present  XI: trapezius strength  5/5  XI: sternocleidomastoid strength 5/5  XI: neck flexion strength  5/5  XII: tongue strength  Normal    Data Review Lab Results  Component Value Date   WBC 6.7 10/16/2016   HGB 13.4 10/16/2016   HCT 40.9 10/16/2016   MCV 98.1 10/16/2016   PLT 207 10/16/2016   Lab Results  Component Value Date   NA 139 10/16/2016   K 5.1 10/16/2016   CL 105 10/16/2016   CO2 24 10/16/2016   BUN 23 (H) 10/16/2016   CREATININE 1.31 (H) 10/16/2016   GLUCOSE 117 (H) 10/16/2016   No results found for: INR,  PROTIME  Assessment/Plan: Left L3-4 and L4-5 stenosis, bulging disc, lumbar radiculopathy, lumbago: I have discussed the situation with the patient and his family. I reviewed his mother CT with him and pointed out the abnormalities. We have discussed the various treatment options including surgery. I have described the surgical treatment option of a left L3-4 and L4-5 laminotomy/foraminotomy and possible microdiscectomy. I have shown them surgical models. We have discussed the risks, benefits, alternatives, expected postoperative course, and likelihood of achieving her goals with surgery. I have answered all their questions. He has decided to proceed with surgery.   Manraj Yeo D 10/18/2016 10:35 AM

## 2016-10-19 ENCOUNTER — Encounter (HOSPITAL_COMMUNITY): Payer: Self-pay | Admitting: Neurosurgery

## 2016-11-07 ENCOUNTER — Ambulatory Visit (INDEPENDENT_AMBULATORY_CARE_PROVIDER_SITE_OTHER): Payer: Medicare Other | Admitting: Podiatry

## 2016-11-07 ENCOUNTER — Encounter: Payer: Self-pay | Admitting: Podiatry

## 2016-11-07 DIAGNOSIS — L600 Ingrowing nail: Secondary | ICD-10-CM

## 2016-11-07 MED ORDER — GENTAMICIN SULFATE 0.1 % EX CREA: 1.0000 "application " | TOPICAL_CREAM | Freq: Three times a day (TID) | CUTANEOUS | 1 refills | Status: DC

## 2016-11-10 NOTE — Progress Notes (Signed)
   Subjective: Patient presents today for evaluation of pain to the medial border of the left great toe that has been present for the past 1-2 weeks. He states the area still has mild soreness but has improved. Patient is concerned for possible ingrown nail. He has been using melaluca oil as treatment. Patient presents today for further treatment and evaluation.  Past Medical History:  Diagnosis Date  . Anemia   . B12 deficiency   . Bradycardia   . Chronic kidney disease    chronic renal insufficency  . Diabetes mellitus without complication (HCC)   . GERD (gastroesophageal reflux disease)   . Hyperkalemia   . Hyperlipidemia   . Hypertension   . Impotence   . PVC's (premature ventricular contractions)   . Sleep apnea    no CPAP in 15 years    Objective:  General: Well developed, nourished, in no acute distress, alert and oriented x3   Dermatology: Skin is warm, dry and supple bilateral. Medial border of the left great toe appears to be erythematous with evidence of an ingrowing nail. Pain on palpation noted to the border of the nail fold. Localized purulent drainage noted to the nail border. The remaining nails appear unremarkable at this time. There are no open sores, lesions.  Vascular: Dorsalis Pedis artery and Posterior Tibial artery pedal pulses palpable. No lower extremity edema noted.   Neruologic: Grossly intact via light touch bilateral.  Musculoskeletal: Muscular strength within normal limits in all groups bilateral. Normal range of motion noted to all pedal and ankle joints.   Assesement: #1 Paronychia with ingrowing nail medial border of the left great toe #2 Pain in toe #3 Incurvated nail  Plan of Care:  1. Patient evaluated.  2. Discussed treatment alternatives and plan of care. Explained nail avulsion procedure and post procedure course to patient. 3. Patient opted for conservative treatment of a nail trim.Marland Kitchen  4. Mechanical debridement of nails left great  toenail performed using a nail nipper. Filed with dremel without incident.  5. Prescription for gentamicin cream given to patient. 6. Return to clinic in 2 weeks.   Felecia Shelling, DPM [facility]  Dr. Felecia Shelling, DPM    [address]                                        [place], [place] [address]                Office [phone]  Fax [phone]

## 2016-11-21 ENCOUNTER — Encounter: Payer: Self-pay | Admitting: Podiatry

## 2016-11-21 ENCOUNTER — Ambulatory Visit (INDEPENDENT_AMBULATORY_CARE_PROVIDER_SITE_OTHER): Payer: Medicare Other | Admitting: Podiatry

## 2016-11-21 DIAGNOSIS — L600 Ingrowing nail: Secondary | ICD-10-CM

## 2016-11-22 NOTE — Progress Notes (Signed)
   Subjective: Patient presents today for follow-up evaluation of an ingrown nail to the medial border of the left great toe. He states he has been using the gentamicin cream with significant relief. He states the toe is better and his pain is 0/10. He denies any new complaints at this time.   Past Medical History:  Diagnosis Date  . Anemia   . B12 deficiency   . Bradycardia   . Chronic kidney disease    chronic renal insufficency  . Diabetes mellitus without complication (HCC)   . GERD (gastroesophageal reflux disease)   . Hyperkalemia   . Hyperlipidemia   . Hypertension   . Impotence   . PVC's (premature ventricular contractions)   . Sleep apnea    no CPAP in 15 years    Objective: Skin is warm, dry and supple. Nail and respective nail fold appears to be healing appropriately. No signs of infection.  Assessment: #1  Ingrown nail to medial border of the left great toe-healed   Plan of care: #1 patient was evaluated  #2  recommended that shoe gear. #3 patient is to return to clinic for next routine nail care appointment.   Felecia Shelling, DPM [facility]  Dr. Felecia Shelling, DPM    [address]                                        [place], [place] [address]                Office [phone]  Fax [phone]

## 2016-12-22 ENCOUNTER — Encounter: Payer: Self-pay | Admitting: Podiatry

## 2016-12-22 ENCOUNTER — Ambulatory Visit (INDEPENDENT_AMBULATORY_CARE_PROVIDER_SITE_OTHER): Payer: Medicare Other | Admitting: Podiatry

## 2016-12-22 DIAGNOSIS — E0842 Diabetes mellitus due to underlying condition with diabetic polyneuropathy: Secondary | ICD-10-CM | POA: Diagnosis not present

## 2016-12-22 DIAGNOSIS — B351 Tinea unguium: Secondary | ICD-10-CM

## 2016-12-22 DIAGNOSIS — M79676 Pain in unspecified toe(s): Secondary | ICD-10-CM | POA: Diagnosis not present

## 2016-12-25 NOTE — Progress Notes (Signed)
   SUBJECTIVE Patient with a history of diabetes mellitus presents to office today complaining of elongated, thickened nails. Pain while ambulating in shoes. Patient is unable to trim their own nails.   Past Medical History:  Diagnosis Date  . Anemia   . B12 deficiency   . Bradycardia   . Chronic kidney disease    chronic renal insufficency  . Diabetes mellitus without complication (HCC)   . GERD (gastroesophageal reflux disease)   . Hyperkalemia   . Hyperlipidemia   . Hypertension   . Impotence   . PVC's (premature ventricular contractions)   . Sleep apnea    no CPAP in 15 years    OBJECTIVE General Patient is awake, alert, and oriented x 3 and in no acute distress. Derm Skin is dry and supple bilateral. Negative open lesions or macerations. Remaining integument unremarkable. Nails are tender, long, thickened and dystrophic with subungual debris, consistent with onychomycosis, 1-5 bilateral. No signs of infection noted. Vasc  DP and PT pedal pulses palpable bilaterally. Temperature gradient within normal limits.  Neuro Epicritic and protective threshold sensation diminished bilaterally.  Musculoskeletal Exam No symptomatic pedal deformities noted bilateral. Muscular strength within normal limits.  ASSESSMENT 1. Diabetes Mellitus w/ peripheral neuropathy 2. Onychomycosis of nail due to dermatophyte bilateral 3. Pain in foot bilateral  PLAN OF CARE 1. Patient evaluated today. 2. Instructed to maintain good pedal hygiene and foot care. Stressed importance of controlling blood sugar.  3. Mechanical debridement of nails 1-5 bilaterally performed using a nail nipper. Filed with dremel without incident.  4. Return to clinic in 3 mos.     Felecia ShellingBrent M. Destinae Neubecker, DPM [facility]  Dr. Felecia ShellingBrent M. Deja Pisarski, DPM    [address]                                        [place], [place] [address]                Office [phone]  Fax [phone]

## 2017-01-30 ENCOUNTER — Other Ambulatory Visit: Payer: Self-pay | Admitting: Family Medicine

## 2017-01-30 ENCOUNTER — Ambulatory Visit
Admission: RE | Admit: 2017-01-30 | Discharge: 2017-01-30 | Disposition: A | Discharge status: Home / Self Care | Payer: Medicare Other | Source: Ambulatory Visit | Attending: Family Medicine | Admitting: Family Medicine

## 2017-01-30 DIAGNOSIS — M7989 Other specified soft tissue disorders: Secondary | ICD-10-CM

## 2017-02-22 ENCOUNTER — Other Ambulatory Visit: Payer: Self-pay | Admitting: Physician Assistant

## 2017-02-22 DIAGNOSIS — M2392 Unspecified internal derangement of left knee: Secondary | ICD-10-CM

## 2017-03-06 ENCOUNTER — Ambulatory Visit
Admission: RE | Admit: 2017-03-06 | Discharge: 2017-03-06 | Disposition: A | Discharge status: Home / Self Care | Payer: Medicare Other | Source: Ambulatory Visit | Attending: Physician Assistant | Admitting: Physician Assistant

## 2017-03-06 DIAGNOSIS — R600 Localized edema: Secondary | ICD-10-CM | POA: Diagnosis not present

## 2017-03-06 DIAGNOSIS — X58XXXA Exposure to other specified factors, initial encounter: Secondary | ICD-10-CM | POA: Diagnosis not present

## 2017-03-06 DIAGNOSIS — M2392 Unspecified internal derangement of left knee: Secondary | ICD-10-CM | POA: Diagnosis present

## 2017-03-06 DIAGNOSIS — M25461 Effusion, right knee: Secondary | ICD-10-CM | POA: Diagnosis not present

## 2017-03-06 DIAGNOSIS — M7122 Synovial cyst of popliteal space [Baker], left knee: Secondary | ICD-10-CM | POA: Insufficient documentation

## 2017-03-06 DIAGNOSIS — S83242A Other tear of medial meniscus, current injury, left knee, initial encounter: Secondary | ICD-10-CM | POA: Insufficient documentation

## 2017-03-06 DIAGNOSIS — M84362A Stress fracture, left tibia, initial encounter for fracture: Secondary | ICD-10-CM | POA: Diagnosis not present

## 2017-03-20 ENCOUNTER — Other Ambulatory Visit: Payer: Self-pay

## 2017-03-20 ENCOUNTER — Encounter
Admission: RE | Admit: 2017-03-20 | Discharge: 2017-03-20 | Disposition: A | Discharge status: Home / Self Care | Payer: Medicare Other | Source: Ambulatory Visit | Attending: Orthopedic Surgery | Admitting: Orthopedic Surgery

## 2017-03-20 DIAGNOSIS — Z01812 Encounter for preprocedural laboratory examination: Secondary | ICD-10-CM | POA: Insufficient documentation

## 2017-03-20 DIAGNOSIS — E119 Type 2 diabetes mellitus without complications: Secondary | ICD-10-CM | POA: Insufficient documentation

## 2017-03-20 DIAGNOSIS — Z01810 Encounter for preprocedural cardiovascular examination: Secondary | ICD-10-CM | POA: Diagnosis present

## 2017-03-20 DIAGNOSIS — I10 Essential (primary) hypertension: Secondary | ICD-10-CM | POA: Diagnosis not present

## 2017-03-20 HISTORY — DX: Pneumonia, unspecified organism: J18.9

## 2017-03-20 HISTORY — DX: Diverticulosis of intestine, part unspecified, without perforation or abscess without bleeding: K57.90

## 2017-03-20 LAB — BASIC METABOLIC PANEL
ANION GAP: 8 (ref 5–15)
BUN: 22 mg/dL — ABNORMAL HIGH (ref 6–20)
CALCIUM: 8.5 mg/dL — AB (ref 8.9–10.3)
CO2: 26 mmol/L (ref 22–32)
CREATININE: 1.23 mg/dL (ref 0.61–1.24)
Chloride: 105 mmol/L (ref 101–111)
GFR calc Af Amer: >60 mL/min (ref >60)
GFR calc non Af Amer: 54 mL/min — ABNORMAL LOW (ref >60)
GLUCOSE: 161 mg/dL — AB (ref 65–99)
Potassium: 4.1 mmol/L (ref 3.5–5.1)
Sodium: 139 mmol/L (ref 135–145)

## 2017-03-20 LAB — CBC
HCT: 38.5 % — ABNORMAL LOW (ref 40.0–52.0)
HEMOGLOBIN: 12.9 g/dL — AB (ref 13.0–18.0)
MCH: 33.2 pg (ref 26.0–34.0)
MCHC: 33.6 g/dL (ref 32.0–36.0)
MCV: 98.9 fL (ref 80.0–100.0)
PLATELETS: 184 10*3/uL (ref 150–440)
RBC: 3.89 MIL/uL — ABNORMAL LOW (ref 4.40–5.90)
RDW: 13.2 % (ref 11.5–14.5)
WBC: 6.1 10*3/uL (ref 3.8–10.6)

## 2017-03-20 LAB — APTT: aPTT: 31 seconds (ref 24–36)

## 2017-03-20 LAB — PROTIME-INR
INR: 0.98
PROTHROMBIN TIME: 12.9 s (ref 11.4–15.2)

## 2017-03-20 NOTE — Patient Instructions (Signed)
Your procedure is scheduled on: Monday, March 26, 2017 Report to Same Day Surgery on the 2nd floor in the [facility]. To find out your arrival time, please call [phone] between 1PM - 3PM on: Friday, March 23, 2017  REMEMBER: Instructions that are not followed completely may result in serious medical risk, up to and including death; or upon the discretion of your surgeon and anesthesiologist your surgery may need to be rescheduled.  Do not eat food after midnight the night before your procedure.  No gum chewing or hard candies.  You may however, drink water up to 2 hours before you are scheduled to arrive at the hospital for your procedure.  Do not drink water within 2 hours of the start of your surgery.  No Alcohol for 24 hours before or after surgery.  No Smoking including e-cigarettes for 24 hours prior to surgery. No chewable tobacco products for at least 6 hours prior to surgery. No nicotine patches on the day of surgery.  On the morning of surgery brush your teeth with toothpaste and water, you may rinse your mouth with mouthwash if you wish. Do not swallow any  toothpaste of mouthwash.  Notify your doctor if there is any change in your medical condition (cold, fever, infection).  Do not wear jewelry, make-up, hairpins, clips or nail polish.  Do not wear lotions, powders, or perfumes. You may wear deodorant.  Do not shave 48 hours prior to surgery. Men may shave face and neck.  Contacts and dentures may not be worn into surgery.  Do not bring valuables to the hospital. [facility] is not responsible for any belongings or valuables.  TAKE THESE MEDICATIONS THE MORNING OF SURGERY WITH A SIP OF WATER:  NONE  Use CHG Soap or wipes as directed on instruction sheet.  Stop Metformin 2 days prior to surgery. LAST DOSE TAKEN ON Friday, March 23, 2017 (RESUME AFTER SURGERY)  NOW!  Stop ASPRIN and Anti-inflammatories such as Advil, Aleve, Ibuprofen, Motrin, Naproxen,  Naprosyn, Goodie powder, or aspirin products. (May take Tylenol or Acetaminophen if needed.)  Stop ANY OVER THE COUNTER supplements until after surgery. (glucosamine, juice plus vitamins)  (May continue Vitamin B.)  If you are being discharged the day of surgery, you will not be allowed to drive home. You will need someone to drive you home and stay with you that night.   If you are taking public transportation, you will need to have a responsible adult to with you.  Please call the number above if you have any questions about these instructions.

## 2017-03-25 ENCOUNTER — Encounter: Payer: Self-pay | Admitting: Orthopedic Surgery

## 2017-03-26 ENCOUNTER — Other Ambulatory Visit: Payer: Self-pay

## 2017-03-26 ENCOUNTER — Encounter
Admission: RE | Disposition: A | Discharge status: Home / Self Care | Payer: Self-pay | Source: Ambulatory Visit | Attending: Orthopedic Surgery

## 2017-03-26 ENCOUNTER — Ambulatory Visit: Payer: Medicare Other | Admitting: Anesthesiology

## 2017-03-26 ENCOUNTER — Encounter: Payer: Self-pay | Admitting: *Deleted

## 2017-03-26 ENCOUNTER — Ambulatory Visit
Admission: RE | Admit: 2017-03-26 | Discharge: 2017-03-26 | Disposition: A | Discharge status: Home / Self Care | Payer: Medicare Other | Source: Ambulatory Visit | Attending: Orthopedic Surgery | Admitting: Orthopedic Surgery

## 2017-03-26 DIAGNOSIS — M94262 Chondromalacia, left knee: Secondary | ICD-10-CM | POA: Insufficient documentation

## 2017-03-26 HISTORY — PX: KNEE ARTHROSCOPY: SHX127

## 2017-03-26 LAB — GLUCOSE, CAPILLARY
Glucose-Capillary: 138 mg/dL — ABNORMAL HIGH (ref 65–99)
Glucose-Capillary: 122 mg/dL — ABNORMAL HIGH (ref 65–99)

## 2017-03-26 SURGERY — ARTHROSCOPY, KNEE
Anesthesia: General | Site: Knee | Laterality: Left

## 2017-03-26 MED ORDER — PROPOFOL 10 MG/ML IV BOLUS
INTRAVENOUS | Status: DC | PRN
  Administered 2017-03-26: 130 mg via INTRAVENOUS

## 2017-03-26 MED ORDER — HYDROCODONE-ACETAMINOPHEN 5-325 MG PO TABS
1.0000 | ORAL_TABLET | ORAL | Status: DC | PRN
  Administered 2017-03-26: 1 via ORAL

## 2017-03-26 MED ORDER — ONDANSETRON HCL 4 MG/2ML IJ SOLN
INTRAMUSCULAR | Status: DC | PRN
  Administered 2017-03-26: 4 mg via INTRAVENOUS

## 2017-03-26 MED ORDER — SODIUM CHLORIDE 0.9 % IV SOLN
INTRAVENOUS | Status: DC
  Administered 2017-03-26 (×2): via INTRAVENOUS

## 2017-03-26 MED ORDER — FENTANYL CITRATE (PF) 100 MCG/2ML IJ SOLN
INTRAMUSCULAR | Status: DC | PRN
  Administered 2017-03-26 (×2): 25 ug via INTRAVENOUS
  Administered 2017-03-26: 50 ug via INTRAVENOUS

## 2017-03-26 MED ORDER — ACETAMINOPHEN 10 MG/ML IV SOLN
INTRAVENOUS | Status: DC | PRN
  Administered 2017-03-26: 1000 mg via INTRAVENOUS

## 2017-03-26 MED ORDER — MORPHINE SULFATE 4 MG/ML IJ SOLN
INTRAMUSCULAR | Status: DC | PRN
Start: 2017-03-26 — End: 2017-03-26
  Administered 2017-03-26: 4 mg via INTRAMUSCULAR

## 2017-03-26 MED ORDER — EPHEDRINE SULFATE 50 MG/ML IJ SOLN
INTRAMUSCULAR | Status: DC | PRN
  Administered 2017-03-26: 10 mg via INTRAVENOUS

## 2017-03-26 MED ORDER — HYDROCODONE-ACETAMINOPHEN 5-325 MG PO TABS: 1.0000 | ORAL_TABLET | ORAL | 0 refills | Status: DC | PRN

## 2017-03-26 MED ORDER — FENTANYL CITRATE (PF) 100 MCG/2ML IJ SOLN
INTRAMUSCULAR | Status: AC
  Filled 2017-03-26: qty 2

## 2017-03-26 MED ORDER — HYDROCODONE-ACETAMINOPHEN 5-325 MG PO TABS
ORAL_TABLET | ORAL | Status: AC
  Administered 2017-03-26: 1 via ORAL
  Filled 2017-03-26: qty 1

## 2017-03-26 MED ORDER — FAMOTIDINE 20 MG PO TABS
20.0000 mg | ORAL_TABLET | Freq: Once | ORAL | Status: AC
  Administered 2017-03-26: 20 mg via ORAL

## 2017-03-26 MED ORDER — MORPHINE SULFATE (PF) 4 MG/ML IV SOLN
INTRAVENOUS | Status: AC
  Filled 2017-03-26: qty 1

## 2017-03-26 MED ORDER — BUPIVACAINE-EPINEPHRINE 0.25% -1:200000 IJ SOLN
INTRAMUSCULAR | Status: DC | PRN
  Administered 2017-03-26: 5 mL
  Administered 2017-03-26: 25 mL

## 2017-03-26 MED ORDER — ONDANSETRON HCL 4 MG/2ML IJ SOLN: 4.0000 mg | Freq: Once | INTRAMUSCULAR | Status: DC | PRN

## 2017-03-26 MED ORDER — FAMOTIDINE 20 MG PO TABS
ORAL_TABLET | ORAL | Status: DC
Start: 2017-03-26 — End: 2017-03-26
  Filled 2017-03-26: qty 1

## 2017-03-26 MED ORDER — PHENYLEPHRINE HCL 10 MG/ML IJ SOLN
INTRAMUSCULAR | Status: DC | PRN
  Administered 2017-03-26 (×2): 100 ug via INTRAVENOUS

## 2017-03-26 MED ORDER — PROPOFOL 10 MG/ML IV BOLUS
INTRAVENOUS | Status: AC
  Filled 2017-03-26: qty 20

## 2017-03-26 MED ORDER — CHLORHEXIDINE GLUCONATE 4 % EX LIQD
60.0000 mL | Freq: Once | CUTANEOUS | Status: AC
  Administered 2017-03-26: 4 via TOPICAL

## 2017-03-26 MED ORDER — LIDOCAINE HCL (CARDIAC) 20 MG/ML IV SOLN
INTRAVENOUS | Status: DC | PRN
  Administered 2017-03-26: 100 mg via INTRAVENOUS

## 2017-03-26 MED ORDER — FENTANYL CITRATE (PF) 100 MCG/2ML IJ SOLN: 25.0000 ug | INTRAMUSCULAR | Status: DC | PRN

## 2017-03-26 MED ORDER — BUPIVACAINE-EPINEPHRINE (PF) 0.25% -1:200000 IJ SOLN
INTRAMUSCULAR | Status: AC
  Filled 2017-03-26: qty 30

## 2017-03-26 SURGICAL SUPPLY — 22 items
BLADE SHAVER 4.5 DBL SERAT CV (CUTTER) IMPLANT
CUFF TOURN 24 STER (MISCELLANEOUS) IMPLANT
CUFF TOURN 30 STER DUAL PORT (MISCELLANEOUS) IMPLANT
DRSG DERMACEA 8X12 NADH (GAUZE/BANDAGES/DRESSINGS) ×3 IMPLANT
DURAPREP 26ML APPLICATOR (WOUND CARE) ×6 IMPLANT
GAUZE SPONGE 4X4 12PLY STRL (GAUZE/BANDAGES/DRESSINGS) ×3 IMPLANT
GLOVE BIOGEL M STRL SZ7.5 (GLOVE) ×3 IMPLANT
GLOVE INDICATOR 8.0 STRL GRN (GLOVE) ×3 IMPLANT
GOWN STRL REUS W/ TWL LRG LVL3 (GOWN DISPOSABLE) ×2 IMPLANT
GOWN STRL REUS W/TWL LRG LVL3 (GOWN DISPOSABLE) ×4
IV LACTATED RINGER IRRG 3000ML (IV SOLUTION) ×12
IV LR IRRIG 3000ML ARTHROMATIC (IV SOLUTION) ×6 IMPLANT
KIT TURNOVER KIT A (KITS) ×3 IMPLANT
MANIFOLD NEPTUNE II (INSTRUMENTS) ×3 IMPLANT
PACK ARTHROSCOPY KNEE (MISCELLANEOUS) ×3 IMPLANT
SET TUBE SUCT SHAVER OUTFL 24K (TUBING) ×3 IMPLANT
SET TUBE TIP INTRA-ARTICULAR (MISCELLANEOUS) ×3 IMPLANT
SUT ETHILON 3-0 FS-10 30 BLK (SUTURE) ×3
SUTURE EHLN 3-0 FS-10 30 BLK (SUTURE) ×1 IMPLANT
TUBING ARTHRO INFLOW-ONLY STRL (TUBING) ×3 IMPLANT
WAND HAND CNTRL MULTIVAC 50 (MISCELLANEOUS) ×3 IMPLANT
WRAP KNEE W/COLD PACKS 25.5X14 (SOFTGOODS) ×3 IMPLANT

## 2017-03-26 NOTE — H&P (Signed)
The patient has been re-examined, and the chart reviewed, and there have been no interval changes to the documented history and physical.    The risks, benefits, and alternatives have been discussed at length. The patient expressed understanding of the risks benefits and agreed with plans for surgical intervention.  Myquan Schaumburg P. Sulayman Manning, Jr. M.D.    

## 2017-03-26 NOTE — Anesthesia Post-op Follow-up Note (Signed)
Anesthesia QCDR form completed.        

## 2017-03-26 NOTE — Op Note (Signed)
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   ASSISTANT: none  ANESTHESIA: general  ESTIMATED BLOOD LOSS: Minimal  FLUIDS REPLACED: 1000 mL of crystalloid  TOURNIQUET TIME: Not used  DRAINS: none  IMPLANTS UTILIZED: None  INDICATIONS FOR SURGERY: Jermaine White is a 82 y.o. year old male who has been seen for complaints of left knee pain. MRI demonstrated findings consistent with meniscal pathology. After discussion of the risks and benefits of surgical intervention, the patient expressed understanding of the risks benefits and agree with plans for left knee arthroscopy.   PROCEDURE IN DETAIL: The patient was brought into the operating room and, after adequate general anesthesia was achieved, a tourniquet was applied to the left thigh and the leg was placed in the leg holder. All bony prominences were well padded. The patient's left knee was cleaned and prepped with alcohol and Duraprep and draped in the usual sterile fashion. A "timeout" was performed as per usual protocol. The anticipated portal sites were injected with 0.25% Marcaine with epinephrine. An anterolateral incision was made and a cannula was inserted. A moderate effusion was evacuated and the knee was distended with fluid using the pump. The scope was advanced down the medial gutter into the medial compartment. Under visualization with the scope, an anteromedial portal was created and a hooked probe was inserted. The medial meniscus was visualized and probed.  There was a complex tear of the posterior horn of the medial  meniscus.  The tear was debrided using meniscal punches and a 4.5 mm incisor shaver.  Final contouring was performed using the 50 degree ArthroCare wand.  The remaining rim of meniscus was visualized and probed and felt to be stable.  The articular cartilage was visualized.  There were grade 3 changes of chondromalacia involving primarily the medial femoral condyle and loss of the medial tibial plateau.  These areas were debrided and contoured using the ArthroCare wand.  The scope was then advanced into the intercondylar notch. The anterior cruciate ligament was visualized and probed and felt to be intact. The scope was removed from the lateral portal and reinserted via the anteromedial portal to better visualize the lateral compartment. The lateral meniscus was visualized and probed.  The lateral meniscus was intact and stable.  The articular cartilage of the lateral compartment was visualized and noted to be in good condition.  Finally, the scope was advanced so as to visualize the patellofemoral articulation. Good patellar tracking was appreciated.  The articular surface was in good condition.  The knee was irrigated with copius amounts of fluid and suctioned dry. The anterolateral portal was re-approximated with #3-0 nylon. A combination of 0.25% Marcaine with epinephrine and 4 mg of Morphine were injected via the scope. The scope was removed and the anteromedial portal was re-approximated with #3-0 nylon. A sterile dressing was applied followed by application of an ice wrap.  The patient tolerated the procedure well and was transported to the PACU in stable condition.  James P. Angie FavaHooten, Jr., M.D.

## 2017-03-26 NOTE — Discharge Instructions (Signed)
AMBULATORY SURGERY  °DISCHARGE INSTRUCTIONS ° ° °1) The drugs that you were given will stay in your system until tomorrow so for the next 24 hours you should not: ° °A) Drive an automobile °B) Make any legal decisions °C) Drink any alcoholic beverage ° ° °2) You may resume regular meals tomorrow.  Today it is better to start with liquids and gradually work up to solid foods. ° °You may eat anything you prefer, but it is better to start with liquids, then soup and crackers, and gradually work up to solid foods. ° ° °3) Please notify your doctor immediately if you have any unusual bleeding, trouble breathing, redness and pain at the surgery site, drainage, fever, or pain not relieved by medication. ° ° ° °4) Additional Instructions: ° ° ° ° ° ° ° °Please contact your physician with any problems or Same Day Surgery at [phone], Monday through Friday 6 am to 4 pm, or [place] at  Main number at [phone]. ° ° °Instructions after Knee Arthroscopy  ° ° James P. Hooten, Jr., M.D.    ° Dept. of Orthopaedics & Sports Medicine ° [facility] ° [address] ° [place], [place]  [address] ° ° Phone: [phone]   Fax: [phone] ° ° °DIET: °• Drink plenty of non-alcoholic fluids & begin a light diet. °• Resume your normal diet the day after surgery. ° °ACTIVITY:  °• You may use crutches or a walker with weight-bearing as tolerated, unless instructed otherwise. °• You may wean yourself off of the walker or crutches as tolerated.  °• Begin doing gentle exercises. Exercising will reduce the pain and swelling, increase motion, and prevent muscle weakness.   °• Avoid strenuous activities or athletics for a minimum of 4-6 weeks after arthroscopic surgery. °• Do not drive or operate any equipment until instructed. ° °WOUND CARE:  °• Place one to two pillows under the knee the first day or two when sitting or lying.  °• Continue to use the ice packs periodically to reduce pain and swelling. °• The small  incisions in your knee are closed with nylon stitches. The stitches will be removed in the office. °• The bulky dressing may be removed on the second day after surgery. DO NOT TOUCH THE STITCHES. Put a Band-Aid over each stitch. Do NOT use any ointments or creams on the incisions.  °• You may bathe or shower after the stitches are removed at the first office visit following surgery. ° °MEDICATIONS: °• You may resume your regular medications. °• Please take the pain medication as prescribed. °• Do not take pain medication on an empty stomach. °• Do not drive or drink alcoholic beverages when taking pain medications. ° °CALL THE OFFICE FOR: °• Temperature above 101 degrees °• Excessive bleeding or drainage on the dressing. °• Excessive swelling, coldness, or paleness of the toes. °• Persistent nausea and vomiting. ° °FOLLOW-UP:  °• You should have an appointment to return to the office in 7-10 days after surgery.  °  °

## 2017-03-26 NOTE — Anesthesia Preprocedure Evaluation (Signed)
Anesthesia Evaluation  Patient identified by MRN, date of birth, ID band Patient awake    Reviewed: Allergy & Precautions, NPO status , Patient's Chart, lab work & pertinent test results  History of Anesthesia Complications Negative for: history of anesthetic complications  Airway Mallampati: II  TM Distance: >3 FB Neck ROM: Full    Dental  (+) Partial Lower, Poor Dentition, Dental Advidsory Given   Pulmonary neg shortness of breath, Sleep apnea: hx of OSA, lost 50lbs and no longer has OSA. , neg COPD, neg recent URI,    breath sounds clear to auscultation- rhonchi (-) wheezing      Cardiovascular Exercise Tolerance: Good hypertension, Pt. on medications (-) angina(-) CAD, (-) Past MI, (-) Cardiac Stents and (-) CABG + dysrhythmias (PVCs) (-) Valvular Problems/Murmurs Rhythm:Regular Rate:Normal - Systolic murmurs and - Diastolic murmurs    Neuro/Psych negative neurological ROS  negative psych ROS   GI/Hepatic Neg liver ROS, GERD  ,  Endo/Other  diabetes, Type 2, Oral Hypoglycemic Agents  Renal/GU Renal disease (followed by nephrology, never had dialysis)     Musculoskeletal negative musculoskeletal ROS (+)   Abdominal (+) - obese,   Peds  Hematology  (+) anemia ,   Anesthesia Other Findings Past Medical History: No date: Anemia No date: B12 deficiency No date: Chronic kidney disease     Comment: chronic renal insufficency No date: Diabetes mellitus without complication (HCC) No date: GERD (gastroesophageal reflux disease) No date: Hyperkalemia No date: Hyperlipidemia No date: Hypertension No date: Impotence No date: Sleep apnea   Reproductive/Obstetrics                             Anesthesia Physical  Anesthesia Plan  ASA: III  Anesthesia Plan: General   Post-op Pain Management:    Induction: Intravenous  PONV Risk Score and Plan: 1 and Ondansetron and  Dexamethasone  Airway Management Planned: LMA  Additional Equipment:   Intra-op Plan:   Post-operative Plan: Extubation in OR  Informed Consent: I have reviewed the patients History and Physical, chart, labs and discussed the procedure including the risks, benefits and alternatives for the proposed anesthesia with the patient or authorized representative who has indicated his/her understanding and acceptance.   Dental advisory given  Plan Discussed with: CRNA and Anesthesiologist  Anesthesia Plan Comments:         Anesthesia Quick Evaluation

## 2017-03-26 NOTE — Transfer of Care (Signed)
Immediate Anesthesia Transfer of Care Note  Patient: Jermaine White  Procedure(s) Performed: ARTHROSCOPY KNEE, PARTIAL MEDIAL MENISECTOMY, CHONDROPLASTY (Left Knee)  Patient Location: PACU  Anesthesia Type:General  Level of Consciousness: sedated  Airway & Oxygen Therapy: Patient connected to face mask oxygen  Post-op Assessment: Post -op Vital signs reviewed and stable  Post vital signs: stable  Last Vitals:  Vitals:   03/26/17 1420 03/26/17 1708  BP: 122/69 109/70  Pulse: 88 82  Resp: 14 11  Temp: 36.8 C (!) 36.2 C  SpO2: 100% 100%    Last Pain:  Vitals:   03/26/17 1420  TempSrc: Oral  PainSc: 2          Complications: No apparent anesthesia complications

## 2017-03-26 NOTE — Anesthesia Postprocedure Evaluation (Signed)
Anesthesia Post Note  Patient: Jermaine White  Procedure(s) Performed: ARTHROSCOPY KNEE, PARTIAL MEDIAL MENISECTOMY, CHONDROPLASTY (Left Knee)  Patient location during evaluation: PACU Anesthesia Type: General Level of consciousness: awake and alert Pain management: pain level controlled Vital Signs Assessment: post-procedure vital signs reviewed and stable Respiratory status: spontaneous breathing, nonlabored ventilation, respiratory function stable and patient connected to nasal cannula oxygen Cardiovascular status: blood pressure returned to baseline and stable Postop Assessment: no apparent nausea or vomiting Anesthetic complications: no     Last Vitals:  Vitals:   03/26/17 1751 03/26/17 1851  BP: 136/90 132/85  Pulse: 88 87  Resp: 16 16  Temp: 36.8 C   SpO2: 93% 100%    Last Pain:  Vitals:   03/26/17 1851  TempSrc:   PainSc: 3                  Kallie Depolo S

## 2017-03-26 NOTE — Anesthesia Procedure Notes (Signed)
Procedure Name: LMA Insertion Date/Time: 03/26/2017 3:51 PM Performed by: Michaele OfferSavage, Ejay Lashley, CRNA Pre-anesthesia Checklist: Patient identified, Emergency Drugs available, Suction available, Patient being monitored and Timeout performed Patient Re-evaluated:Patient Re-evaluated prior to induction Oxygen Delivery Method: Circle system utilized Preoxygenation: Pre-oxygenation with 100% oxygen Induction Type: IV induction Ventilation: Mask ventilation without difficulty LMA: LMA inserted LMA Size: 4.5 Number of attempts: 1 Placement Confirmation: positive ETCO2 and breath sounds checked- equal and bilateral Tube secured with: Tape Dental Injury: Teeth and Oropharynx as per pre-operative assessment

## 2017-03-27 ENCOUNTER — Encounter: Payer: Self-pay | Admitting: Orthopedic Surgery

## 2017-03-27 ENCOUNTER — Ambulatory Visit: Payer: Medicare Other | Admitting: Podiatry

## 2017-04-10 ENCOUNTER — Ambulatory Visit (INDEPENDENT_AMBULATORY_CARE_PROVIDER_SITE_OTHER): Payer: Medicare Other | Admitting: Podiatry

## 2017-04-10 ENCOUNTER — Encounter: Payer: Self-pay | Admitting: Podiatry

## 2017-04-10 DIAGNOSIS — E0842 Diabetes mellitus due to underlying condition with diabetic polyneuropathy: Secondary | ICD-10-CM | POA: Diagnosis not present

## 2017-04-10 DIAGNOSIS — M79676 Pain in unspecified toe(s): Secondary | ICD-10-CM | POA: Diagnosis not present

## 2017-04-10 DIAGNOSIS — B351 Tinea unguium: Secondary | ICD-10-CM | POA: Diagnosis not present

## 2017-04-10 DIAGNOSIS — L989 Disorder of the skin and subcutaneous tissue, unspecified: Secondary | ICD-10-CM

## 2017-04-12 NOTE — Progress Notes (Signed)
    Subjective: Patient is a 82 y.o. male presenting to the office today with a chief complaint of painful callus lesions to the bilateral feet that have been present for several weeks. Walking increases the pain. He has not done anything to treat the symptoms.   Patient also complains of elongated, thickened nails that cause pain while ambulating in shoes. Patient is unable to trim their own nails. Patient presents today for further treatment and evaluation.  Past Medical History:  Diagnosis Date  . Anemia   . B12 deficiency   . Bradycardia   . Chronic kidney disease    chronic renal insufficency  . Diabetes mellitus without complication (HCC)   . Diverticulosis 01/25/2016  . GERD (gastroesophageal reflux disease)   . Hyperkalemia   . Hyperlipidemia   . Hypertension   . Impotence   . Pneumonia   . PVC's (premature ventricular contractions)   . Sleep apnea    no CPAP in 15 years    Objective:  Physical Exam General: Alert and oriented x3 in no acute distress  Dermatology: Hyperkeratotic lesions present on the bilateral feet x 4. Pain on palpation with a central nucleated core noted. Skin is warm, dry and supple bilateral lower extremities. Negative for open lesions or macerations. Nails are tender, long, thickened and dystrophic with subungual debris, consistent with onychomycosis, 1-5 bilateral. No signs of infection noted.  Vascular: Palpable pedal pulses bilaterally. No edema or erythema noted. Capillary refill within normal limits.  Neurological: Epicritic and protective threshold grossly intact bilaterally.   Musculoskeletal Exam: Pain on palpation at the keratotic lesion noted. Range of motion within normal limits bilateral. Muscle strength 5/5 in all groups bilateral.  Assessment: 1. Onychodystrophic nails 1-5 bilateral with hyperkeratosis of nails.  2. Onychomycosis of nail due to dermatophyte bilateral 3. Pre-ulcerative callus lesions x 4 to the bilateral  feet   Plan of Care:  #1 Patient evaluated. #2 Excisional debridement of keratoic lesion using a chisel blade was performed without incident.  #3 Dressed with light dressing. #4 Mechanical debridement of nails 1-5 bilaterally performed using a nail nipper. Filed with dremel without incident.  #5 Patient is to return to the clinic in 3 months.   Ask how left knee is doing.  Felecia ShellingBrent M. Lilja Soland, DPM [facility]  Dr. Felecia ShellingBrent M. Kristiane Morsch, DPM    [address]                                        [place], [place] [address]                Office [phone]  Fax [phone]

## 2017-07-10 ENCOUNTER — Ambulatory Visit (INDEPENDENT_AMBULATORY_CARE_PROVIDER_SITE_OTHER): Payer: Medicare Other | Admitting: Podiatry

## 2017-07-10 ENCOUNTER — Encounter: Payer: Self-pay | Admitting: Podiatry

## 2017-07-10 DIAGNOSIS — B351 Tinea unguium: Secondary | ICD-10-CM | POA: Diagnosis not present

## 2017-07-10 DIAGNOSIS — M79676 Pain in unspecified toe(s): Secondary | ICD-10-CM | POA: Diagnosis not present

## 2017-07-10 DIAGNOSIS — E0842 Diabetes mellitus due to underlying condition with diabetic polyneuropathy: Secondary | ICD-10-CM

## 2017-07-12 NOTE — Progress Notes (Signed)
   SUBJECTIVE Patient with a history of diabetes mellitus presents to office today complaining of elongated, thickened nails that cause pain while ambulating in shoes. He is unable to trim his own nails. Patient is here for further evaluation and treatment.   Past Medical History:  Diagnosis Date  . Anemia   . B12 deficiency   . Bradycardia   . Chronic kidney disease    chronic renal insufficency  . Diabetes mellitus without complication (HCC)   . Diverticulosis 01/25/2016  . GERD (gastroesophageal reflux disease)   . Hyperkalemia   . Hyperlipidemia   . Hypertension   . Impotence   . Pneumonia   . PVC's (premature ventricular contractions)   . Sleep apnea    no CPAP in 15 years    OBJECTIVE General Patient is awake, alert, and oriented x 3 and in no acute distress. Derm Skin is dry and supple bilateral. Negative open lesions or macerations. Remaining integument unremarkable. Nails are tender, long, thickened and dystrophic with subungual debris, consistent with onychomycosis, 1-5 bilateral. No signs of infection noted. Vasc  DP and PT pedal pulses palpable bilaterally. Temperature gradient within normal limits.  Neuro Epicritic and protective threshold sensation diminished bilaterally.  Musculoskeletal Exam No symptomatic pedal deformities noted bilateral. Muscular strength within normal limits.  ASSESSMENT 1. Diabetes Mellitus w/ peripheral neuropathy 2. Onychomycosis of nail due to dermatophyte bilateral 3. Pain in foot bilateral  PLAN OF CARE 1. Patient evaluated today. 2. Instructed to maintain good pedal hygiene and foot care. Stressed importance of controlling blood sugar.  3. Mechanical debridement of nails 1-5 bilaterally performed using a nail nipper. Filed with dremel without incident.  4. Return to clinic in 3 mos.     Brent M. Evans, DPM [facility]  Dr. Brent M. Evans, DPM    [address]                                          [place], [place] [address]                Office [phone]  Fax [phone]      

## 2017-10-12 ENCOUNTER — Encounter: Payer: Self-pay | Admitting: Podiatry

## 2017-10-12 ENCOUNTER — Ambulatory Visit (INDEPENDENT_AMBULATORY_CARE_PROVIDER_SITE_OTHER): Payer: Medicare Other | Admitting: Podiatry

## 2017-10-12 DIAGNOSIS — M79676 Pain in unspecified toe(s): Secondary | ICD-10-CM

## 2017-10-12 DIAGNOSIS — L989 Disorder of the skin and subcutaneous tissue, unspecified: Secondary | ICD-10-CM

## 2017-10-12 DIAGNOSIS — B351 Tinea unguium: Secondary | ICD-10-CM

## 2017-10-12 DIAGNOSIS — E0842 Diabetes mellitus due to underlying condition with diabetic polyneuropathy: Secondary | ICD-10-CM

## 2017-10-14 NOTE — Progress Notes (Signed)
   SUBJECTIVE Patient with a history of diabetes mellitus presents to office today complaining of elongated, thickened nails that cause pain while ambulating in shoes. He is unable to trim his own nails. Patient is here for further evaluation and treatment.   Past Medical History:  Diagnosis Date  . Anemia   . B12 deficiency   . Bradycardia   . Chronic kidney disease    chronic renal insufficency  . Diabetes mellitus without complication (HCC)   . Diverticulosis 01/25/2016  . GERD (gastroesophageal reflux disease)   . Hyperkalemia   . Hyperlipidemia   . Hypertension   . Impotence   . Pneumonia   . PVC's (premature ventricular contractions)   . Sleep apnea    no CPAP in 15 years    OBJECTIVE General Patient is awake, alert, and oriented x 3 and in no acute distress. Derm Skin is dry and supple bilateral. Negative open lesions or macerations. Remaining integument unremarkable. Nails are tender, long, thickened and dystrophic with subungual debris, consistent with onychomycosis, 1-5 bilateral. No signs of infection noted. Vasc  DP and PT pedal pulses palpable bilaterally. Temperature gradient within normal limits.  Neuro Epicritic and protective threshold sensation diminished bilaterally.  Musculoskeletal Exam No symptomatic pedal deformities noted bilateral. Muscular strength within normal limits.  ASSESSMENT 1. Diabetes Mellitus w/ peripheral neuropathy 2. Onychomycosis of nail due to dermatophyte bilateral 3. Pain in foot bilateral  PLAN OF CARE 1. Patient evaluated today. 2. Instructed to maintain good pedal hygiene and foot care. Stressed importance of controlling blood sugar.  3. Mechanical debridement of nails 1-5 bilaterally performed using a nail nipper. Filed with dremel without incident.  4. Return to clinic in 3 mos.     Felecia ShellingBrent M. Evans, DPM [facility]  Dr. Felecia ShellingBrent M. Evans, DPM    [address]                                          [place], [place] [address]                Office [phone]  Fax [phone]

## 2018-01-08 ENCOUNTER — Ambulatory Visit: Payer: Medicare Other | Admitting: Podiatry

## 2018-01-15 ENCOUNTER — Encounter: Payer: Self-pay | Admitting: Podiatry

## 2018-01-15 ENCOUNTER — Ambulatory Visit (INDEPENDENT_AMBULATORY_CARE_PROVIDER_SITE_OTHER): Payer: Medicare Other | Admitting: Podiatry

## 2018-01-15 DIAGNOSIS — L989 Disorder of the skin and subcutaneous tissue, unspecified: Secondary | ICD-10-CM | POA: Diagnosis not present

## 2018-01-15 DIAGNOSIS — B351 Tinea unguium: Secondary | ICD-10-CM | POA: Diagnosis not present

## 2018-01-15 DIAGNOSIS — E0842 Diabetes mellitus due to underlying condition with diabetic polyneuropathy: Secondary | ICD-10-CM

## 2018-01-15 DIAGNOSIS — M79676 Pain in unspecified toe(s): Secondary | ICD-10-CM

## 2018-01-16 NOTE — Progress Notes (Signed)
    Subjective: Patient is a 82 y.o. male presenting to the office today with a chief complaint of painful callus lesions to the bilateral great toes that have been present for the past few weeks. Walking increases the pain. He has not had any treatment for the symptoms.   Patient also complains of elongated, thickened nails that cause pain while ambulating in shoes. He is unable to trim his own nails. Patient presents today for further treatment and evaluation.  Past Medical History:  Diagnosis Date  . Anemia   . B12 deficiency   . Bradycardia   . Chronic kidney disease    chronic renal insufficency  . Diabetes mellitus without complication (HCC)   . Diverticulosis 01/25/2016  . GERD (gastroesophageal reflux disease)   . Hyperkalemia   . Hyperlipidemia   . Hypertension   . Impotence   . Pneumonia   . PVC's (premature ventricular contractions)   . Sleep apnea    no CPAP in 15 years    Objective:  Physical Exam General: Alert and oriented x3 in no acute distress  Dermatology: Hyperkeratotic lesions present on the bilateral great toes. Pain on palpation with a central nucleated core noted. Skin is warm, dry and supple bilateral lower extremities. Negative for open lesions or macerations. Nails are tender, long, thickened and dystrophic with subungual debris, consistent with onychomycosis, 1-5 bilateral. No signs of infection noted.  Vascular: Palpable pedal pulses bilaterally. No edema or erythema noted. Capillary refill within normal limits.  Neurological: Epicritic and protective threshold diminished bilaterally.   Musculoskeletal Exam: Pain on palpation at the keratotic lesion noted. Range of motion within normal limits bilateral. Muscle strength 5/5 in all groups bilateral.  Assessment: 1. Onychodystrophic nails 1-5 bilateral with hyperkeratosis of nails.  2. Onychomycosis of nail due to dermatophyte bilateral 3. Pre-ulcerative callus lesions noted to the bilateral great  toes   Plan of Care:  1. Patient evaluated. 2. Excisional debridement of keratoic lesion using a chisel blade was performed without incident.  3. Dressed with light dressing. 4. Mechanical debridement of nails 1-5 bilaterally performed using a nail nipper. Filed with dremel without incident.  5. Patient is to return to the clinic in 3 months.   Brent M. Evans, DPM [facility]  Dr. Brent M. Evans, DPM    [address]                                        [place], [place] [address]                Office [phone]  Fax [phone]   

## 2018-02-06 DIAGNOSIS — Z85828 Personal history of other malignant neoplasm of skin: Secondary | ICD-10-CM | POA: Insufficient documentation

## 2018-04-16 ENCOUNTER — Ambulatory Visit (INDEPENDENT_AMBULATORY_CARE_PROVIDER_SITE_OTHER): Payer: Medicare Other | Admitting: Podiatry

## 2018-04-16 ENCOUNTER — Encounter: Payer: Self-pay | Admitting: Podiatry

## 2018-04-16 DIAGNOSIS — B351 Tinea unguium: Secondary | ICD-10-CM | POA: Diagnosis not present

## 2018-04-16 DIAGNOSIS — M79676 Pain in unspecified toe(s): Secondary | ICD-10-CM

## 2018-04-16 DIAGNOSIS — L989 Disorder of the skin and subcutaneous tissue, unspecified: Secondary | ICD-10-CM

## 2018-04-16 DIAGNOSIS — E0842 Diabetes mellitus due to underlying condition with diabetic polyneuropathy: Secondary | ICD-10-CM

## 2018-04-16 MED ORDER — GENTAMICIN SULFATE 0.1 % EX CREA: 1.0000 "application " | TOPICAL_CREAM | Freq: Two times a day (BID) | CUTANEOUS | 2 refills | Status: AC

## 2018-04-17 NOTE — Progress Notes (Signed)
    Subjective: Patient is a 83 y.o. male presenting to the office today with a chief complaint of painful callus lesions to the bilateral great toes that have been present for the past few weeks. Walking increases the pain. He has not had any treatment for the symptoms.   Patient also complains of elongated, thickened nails that cause pain while ambulating in shoes. He is unable to trim his own nails. Patient presents today for further treatment and evaluation.  Past Medical History:  Diagnosis Date  . Anemia   . B12 deficiency   . Bradycardia   . Chronic kidney disease    chronic renal insufficency  . Diabetes mellitus without complication (HCC)   . Diverticulosis 01/25/2016  . GERD (gastroesophageal reflux disease)   . Hyperkalemia   . Hyperlipidemia   . Hypertension   . Impotence   . Pneumonia   . PVC's (premature ventricular contractions)   . Sleep apnea    no CPAP in 15 years    Objective:  Physical Exam General: Alert and oriented x3 in no acute distress  Dermatology: Hyperkeratotic lesions present on the bilateral great toes. Pain on palpation with a central nucleated core noted. Skin is warm, dry and supple bilateral lower extremities. Negative for open lesions or macerations. Nails are tender, long, thickened and dystrophic with subungual debris, consistent with onychomycosis, 1-5 bilateral. No signs of infection noted.  Vascular: Palpable pedal pulses bilaterally. No edema or erythema noted. Capillary refill within normal limits.  Neurological: Epicritic and protective threshold diminished bilaterally.   Musculoskeletal Exam: Pain on palpation at the keratotic lesion noted. Range of motion within normal limits bilateral. Muscle strength 5/5 in all groups bilateral.  Assessment: 1. Onychodystrophic nails 1-5 bilateral with hyperkeratosis of nails.  2. Onychomycosis of nail due to dermatophyte bilateral 3. Pre-ulcerative callus lesions noted to the bilateral great  toes   Plan of Care:  1. Patient evaluated. 2. Excisional debridement of keratoic lesion using a chisel blade was performed without incident.  3. Dressed with light dressing. 4. Mechanical debridement of nails 1-5 bilaterally performed using a nail nipper. Filed with dremel without incident.  5. Patient is to return to the clinic in 3 months.   Felecia Shelling, DPM [facility]  Dr. Felecia Shelling, DPM    [address]                                        [place], [place] [address]                Office [phone]  Fax [phone]

## 2018-07-16 ENCOUNTER — Other Ambulatory Visit: Payer: Self-pay

## 2018-07-16 ENCOUNTER — Ambulatory Visit (INDEPENDENT_AMBULATORY_CARE_PROVIDER_SITE_OTHER): Payer: Medicare Other | Admitting: Podiatry

## 2018-07-16 ENCOUNTER — Encounter: Payer: Self-pay | Admitting: Podiatry

## 2018-07-16 VITALS — Temp 97.9°F

## 2018-07-16 DIAGNOSIS — L989 Disorder of the skin and subcutaneous tissue, unspecified: Secondary | ICD-10-CM

## 2018-07-16 DIAGNOSIS — B351 Tinea unguium: Secondary | ICD-10-CM

## 2018-07-16 DIAGNOSIS — M79676 Pain in unspecified toe(s): Secondary | ICD-10-CM | POA: Diagnosis not present

## 2018-07-16 DIAGNOSIS — E0842 Diabetes mellitus due to underlying condition with diabetic polyneuropathy: Secondary | ICD-10-CM

## 2018-07-18 NOTE — Progress Notes (Signed)
    Subjective: Patient is a 83 y.o. male presenting to the office today for follow up evaluation of callus lesions of the bilateral feet. Walking increases the pain. He has not had any recent treatment for the symptoms.   Patient also complains of elongated, thickened nails that cause pain while ambulating in shoes. He is unable to trim his own nails. Patient presents today for further treatment and evaluation.  Past Medical History:  Diagnosis Date  . Anemia   . B12 deficiency   . Bradycardia   . Chronic kidney disease    chronic renal insufficency  . Diabetes mellitus without complication (HCC)   . Diverticulosis 01/25/2016  . GERD (gastroesophageal reflux disease)   . Hyperkalemia   . Hyperlipidemia   . Hypertension   . Impotence   . Pneumonia   . PVC's (premature ventricular contractions)   . Sleep apnea    no CPAP in 15 years    Objective:  Physical Exam General: Alert and oriented x3 in no acute distress  Dermatology: Hyperkeratotic lesions present on the bilateral feet. Pain on palpation with a central nucleated core noted. Skin is warm, dry and supple bilateral lower extremities. Negative for open lesions or macerations. Nails are tender, long, thickened and dystrophic with subungual debris, consistent with onychomycosis, 1-5 bilateral. No signs of infection noted.  Vascular: Palpable pedal pulses bilaterally. No edema or erythema noted. Capillary refill within normal limits.  Neurological: Epicritic and protective threshold diminished bilaterally.   Musculoskeletal Exam: Pain on palpation at the keratotic lesion noted. Range of motion within normal limits bilateral. Muscle strength 5/5 in all groups bilateral.  Assessment: 1. Onychodystrophic nails 1-5 bilateral with hyperkeratosis of nails.  2. Onychomycosis of nail due to dermatophyte bilateral 3. Pre-ulcerative callus lesions noted to the bilateral feet x 2   Plan of Care:  1. Patient evaluated. 2.  Excisional debridement of keratoic lesion using a chisel blade was performed without incident.  3. Dressed with light dressing. 4. Mechanical debridement of nails 1-5 bilaterally performed using a nail nipper. Filed with dremel without incident.  5. Patient is to return to the clinic in 3 months.   Felecia Shelling, DPM [facility]  Dr. Felecia Shelling, DPM    [address]                                        [place], [place] [address]                Office [phone]  Fax [phone]

## 2018-08-27 IMAGING — XA DG MYELOGRAPHY LUMBAR INJ LUMBOSACRAL
6 of 17 series · 6 of 17 positions shown · non-contrast
Comparison: Lumbar spine MRI 09/06/2015

CLINICAL DATA: Left-sided low back pain radiating into the
anterolateral left lower extremity.
TECHNIQUE: Contiguous axial images were obtained through the Lumbar spine after
the intrathecal infusion of infusion. Coronal and sagittal
reconstructions were obtained of the axial image sets.

[Series 1: vasc adipose · 1 of 1 slices shown (1 of 3)]
[im 1/1]
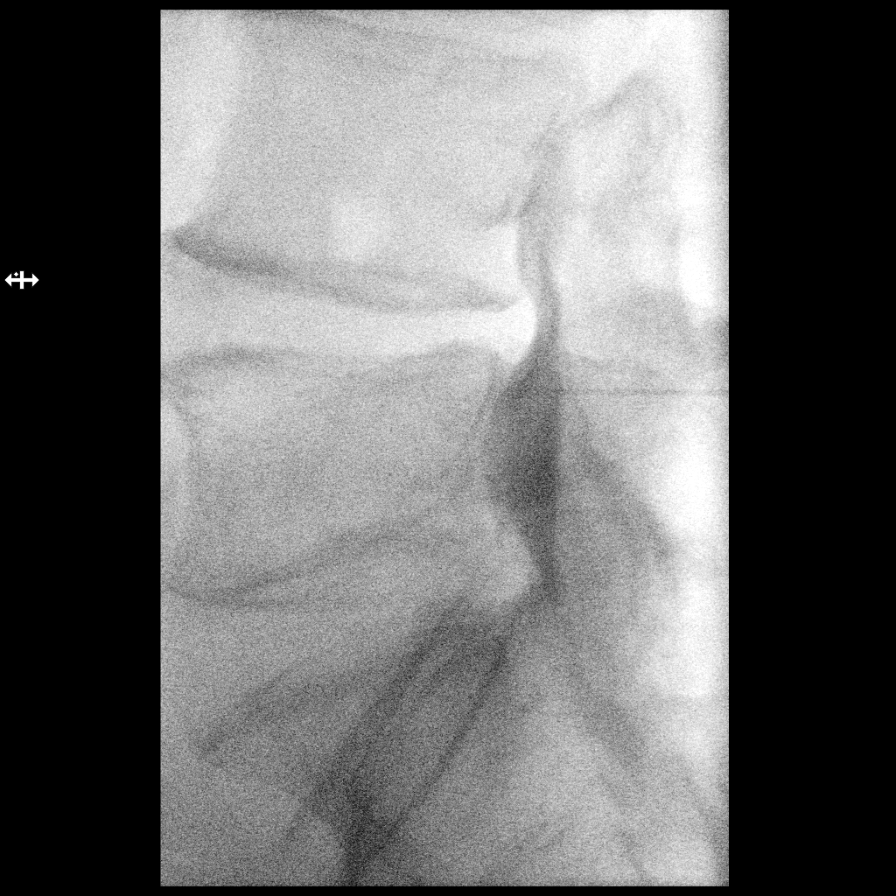

[Series 1: w lumbar spine lat · 0.15mm/px · 1 of 1 slices shown]
[im 1/1]
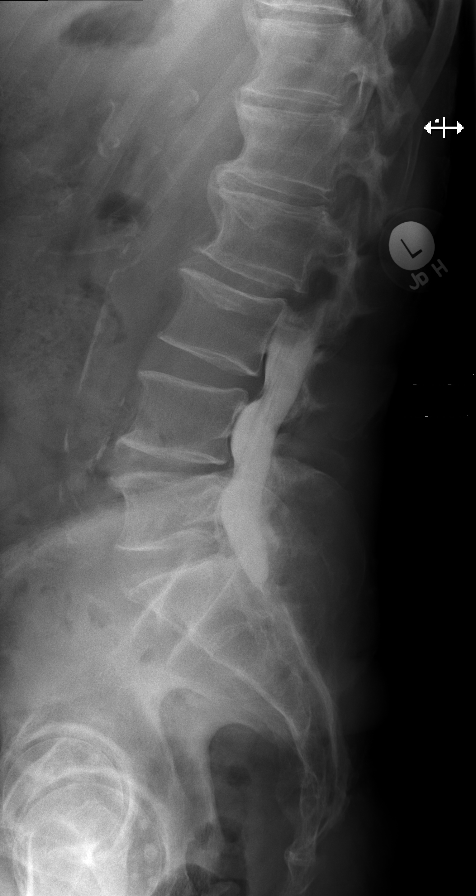

[Series 2: w lumbar spine flexion · 0.15mm/px · 1 of 1 slices shown]
[im 1/1]
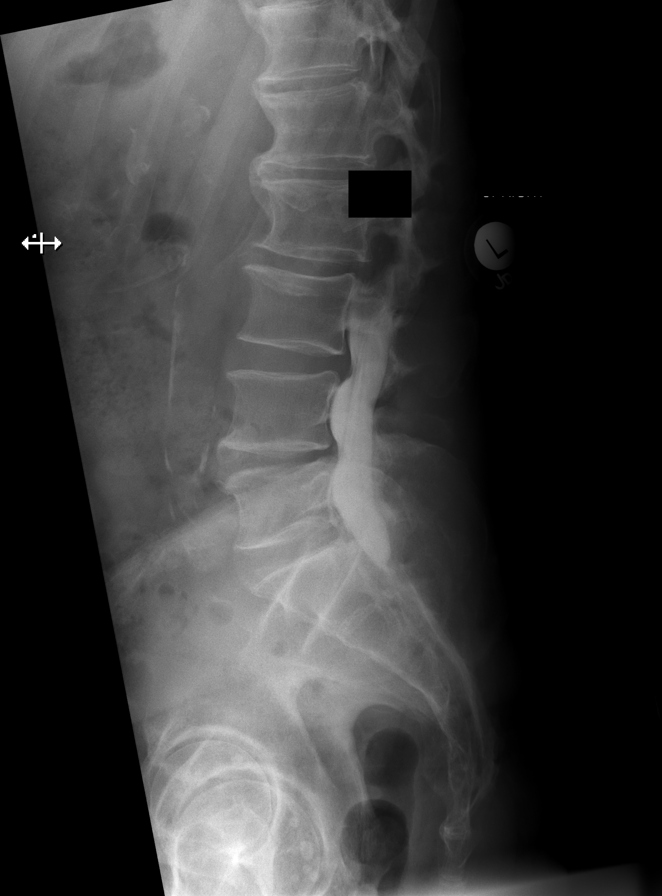

[Series 2: vasc adipose · 1 of 1 slices shown (2 of 3)]
[im 1/1]
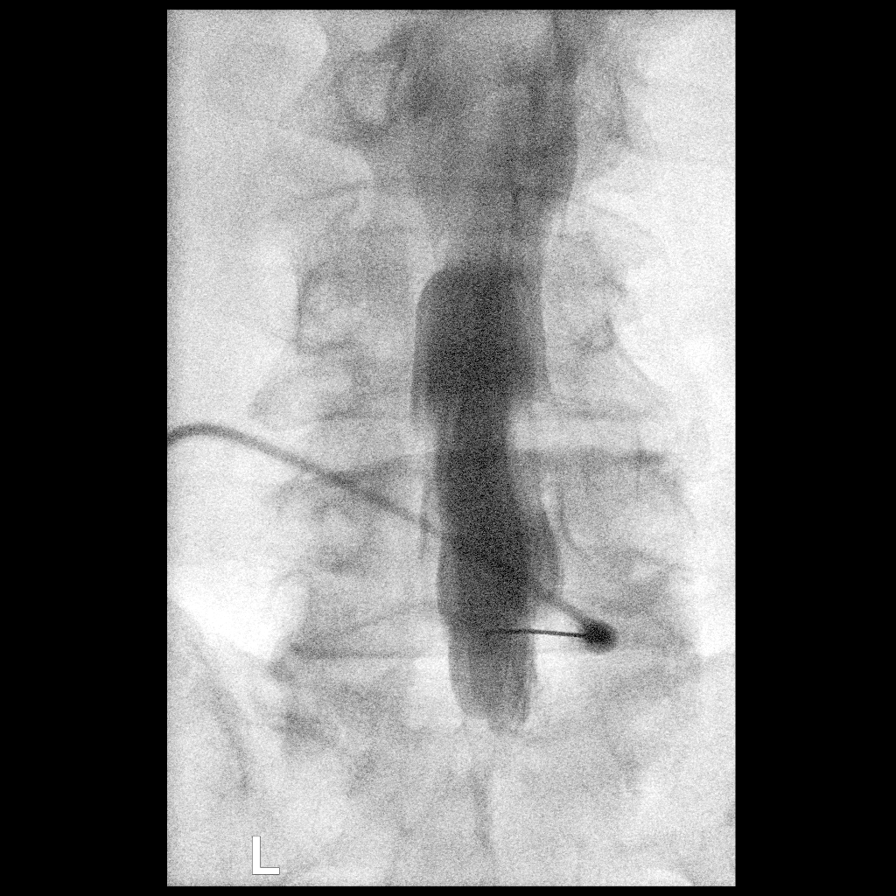

[Series 3: vasc adipose · 1 of 1 slices shown (3 of 3)]
[im 1/1]
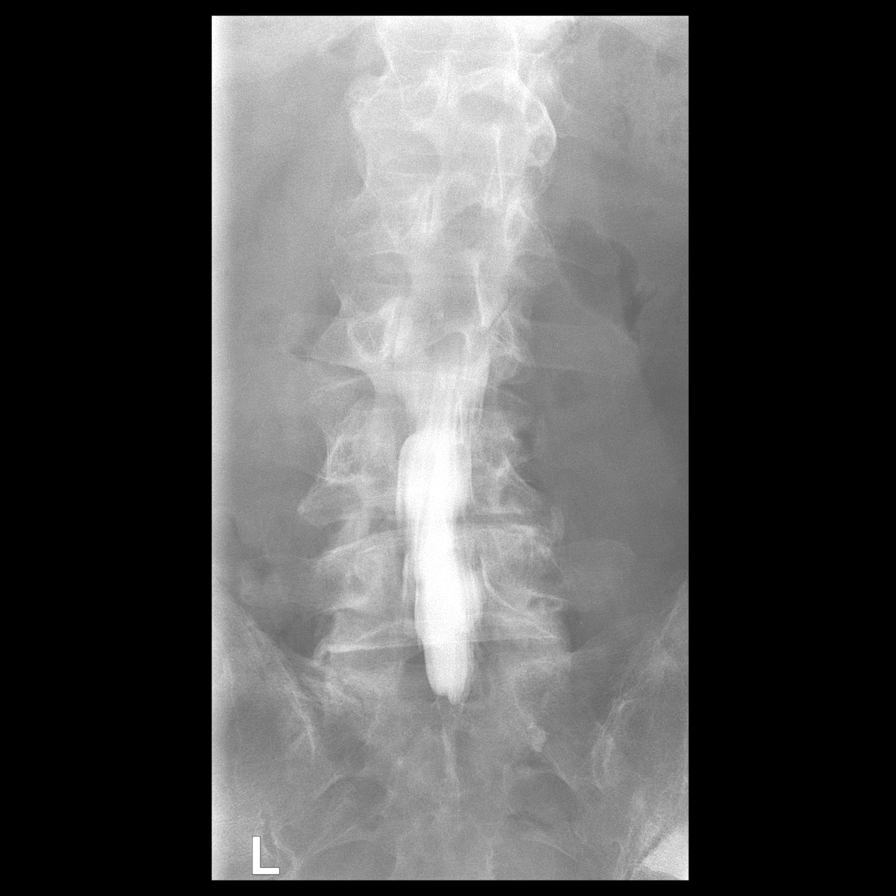

[Series 3: w lumbar spine extension · 0.15mm/px · 1 of 1 slices shown]
[im 1/1]
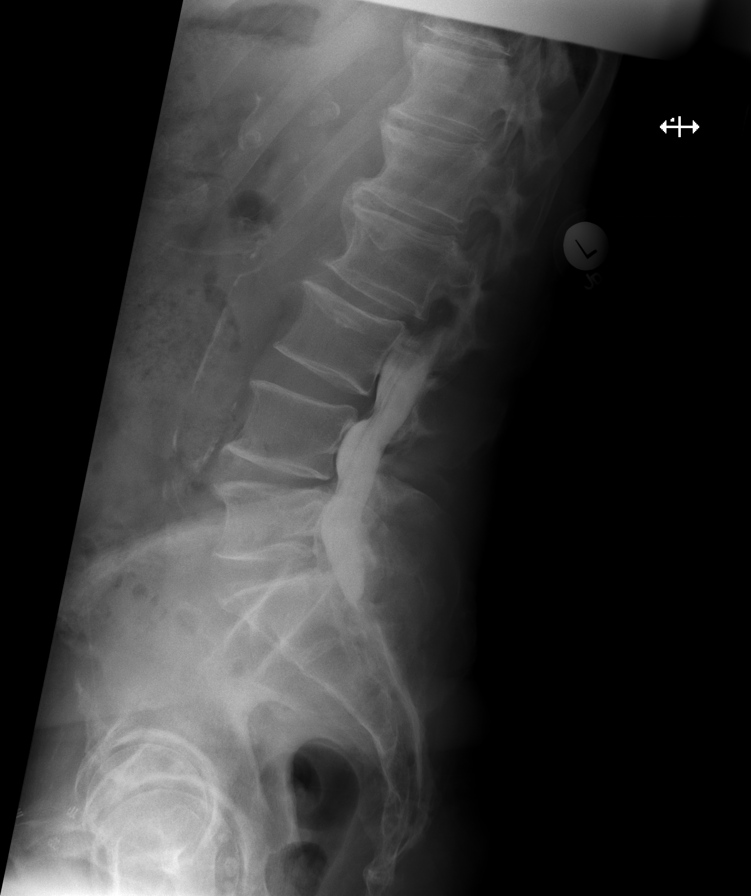

[6 of 17 positions shown; findings below may reference images not displayed]

EXAM:
LUMBAR MYELOGRAM

FLUOROSCOPY TIME:  Radiation Exposure Index (as provided by the
fluoroscopic device): 399.37 microGray*m^2

Fluoroscopy Time (in minutes and seconds):  59 seconds

PROCEDURE:
After thorough discussion of risks and benefits of the procedure
including bleeding, infection, injury to nerves, blood vessels,
adjacent structures as well as headache and CSF leak, written and
oral informed consent was obtained. Consent was obtained by Dr.
Eimhear Nee. Time out form was completed.

Patient was positioned prone on the fluoroscopy table. Local
anesthesia was provided with 1% lidocaine without epinephrine after
prepped and draped in the usual sterile fashion. Puncture was
performed at L4-5 using a 3 1/2 inch 22-gauge spinal needle via a
right interlaminar approach. Using a single pass through the dura,
the needle was placed within the thecal sac, with return of clear
CSF. A small amount of contrast was injected, however there was
concern for a mixed subarachnoid/subdural injection. Puncture was
then performed at L5-S1 via a right interlaminar approach, and
contrast injection at this level demonstrated normal opacification
of the nerve roots and cauda equina consistent with free flow within
the subarachnoid space. A total of 15 mL of Isovue M 200 was
injected into the thecal sac.

I personally performed the lumbar puncture and administered the
intrathecal contrast. I also personally supervised acquisition of
the myelogram images.
FINDINGS: LUMBAR MYELOGRAM FINDINGS:

Normal lumbar segmentation. There is slight retrolisthesis of L2 on
L3 which does not change with flexion or extension. Mild disc space
narrowing is present at L4-5. Ventral extradural defects are present
at L3-4 greater than L4-5 without evidence of high-grade spinal
stenosis. However, there is prominent narrowing of the left lateral
recess at L3-4 with left L4 nerve root cut off. There is evidence of
milder right lateral recess narrowing at L3-4 and bilateral lateral
recess narrowing at L4-5.

CT LUMBAR MYELOGRAM FINDINGS:

Retrolisthesis of L2 on L3 measures 2-3 mm, unchanged from the prior
MRI. There is mild lumbar levoscoliosis with apex at L3. Mild disc
space narrowing is present at L4-5. Multiple Schmorl's nodes are
again seen, most prominent at L4-5 with particular involvement of
the L5 superior endplate. The right-sided L2 inferior and L3
superior articular processes are dysplastic, without a normal facet
joint on this side. The conus medullaris terminates at L1. A small
amount of subdural contrast at L4-5 and small amount of subdural and
epidural contrast at L5-S1 are attributed to the injection. Small
nerve root sleeve diverticula are noted in the neural foramina
bilaterally at T12-L1 and L1-2 and are opacified with contrast.

There is abdominal aortic atherosclerosis without aneurysm. A 1.3 cm
hypoattenuating lesion is partially visualized in the upper pole of
the right kidney and corresponds to a T2 hyperintense lesion on the
prior MRI, compatible with a cyst. An 11 mm intermediate attenuation
lesion arising from the posterior upper pole of the left kidney
corresponds to a slightly T2 hypointense and intermediate T1 signal
intensity lesion on MRI.

T12-L1:  Mild facet arthrosis without disc herniation or stenosis.

L1-2: Predominantly anterior spondylosis. Shallow central disc
protrusion without stenosis, unchanged.

L2-3:  Mild disc bulging without significant stenosis, unchanged.

L3-4: Circumferential disc bulging, left paracentral to subarticular
disc protrusion, and mild facet and ligamentum flavum hypertrophy
result in mild right and severe left lateral recess stenosis with
likely left L4 nerve root impingement, unchanged. There is also
moderate left neural foraminal stenosis which is stable to minimally
increased.

L4-5: Circumferential disc bulging, broad left subarticular to
foraminal disc protrusion, and moderate facet and ligamentum flavum
hypertrophy result in moderate bilateral lateral recess stenosis and
mild-to-moderate right and mild left neural foraminal stenosis, not
significantly changed. No spinal stenosis.

L5-S1: Mild disc bulging and moderate right and severe left facet
arthrosis result in borderline right neural foraminal stenosis
without spinal stenosis, unchanged.
IMPRESSION: 1. L3-4 disc protrusion with severe left lateral recess stenosis and
L4 nerve root impingement, unchanged. Moderate left foraminal
stenosis, stable to slightly increased.
2. Moderate bilateral lateral recess stenosis at L4-5, unchanged.
3. Moderate to severe facet arthrosis at L5-S1 without significant
stenosis.
4. Indeterminate 11 mm left renal lesion. This could be further
evaluated with contrast-enhanced abdominal MRI.

## 2018-10-15 ENCOUNTER — Encounter: Payer: Self-pay | Admitting: Podiatry

## 2018-10-15 ENCOUNTER — Ambulatory Visit (INDEPENDENT_AMBULATORY_CARE_PROVIDER_SITE_OTHER): Payer: Medicare Other | Admitting: Podiatry

## 2018-10-15 ENCOUNTER — Other Ambulatory Visit: Payer: Self-pay

## 2018-10-15 VITALS — Temp 97.3°F

## 2018-10-15 DIAGNOSIS — E0842 Diabetes mellitus due to underlying condition with diabetic polyneuropathy: Secondary | ICD-10-CM | POA: Diagnosis not present

## 2018-10-15 DIAGNOSIS — M79676 Pain in unspecified toe(s): Secondary | ICD-10-CM

## 2018-10-15 DIAGNOSIS — B351 Tinea unguium: Secondary | ICD-10-CM

## 2018-10-15 DIAGNOSIS — L989 Disorder of the skin and subcutaneous tissue, unspecified: Secondary | ICD-10-CM

## 2018-10-17 NOTE — Progress Notes (Signed)
    Subjective: Patient is a 83 y.o. male presenting to the office today for follow up evaluation of callus lesions of the bilateral feet. Walking increases the pain. He has not had any recent treatment for the symptoms.   Patient also complains of elongated, thickened nails that cause pain while ambulating in shoes. He is unable to trim his own nails. Patient presents today for further treatment and evaluation.  Past Medical History:  Diagnosis Date  . Anemia   . B12 deficiency   . Bradycardia   . Chronic kidney disease    chronic renal insufficency  . Diabetes mellitus without complication ([place])   . Diverticulosis 01/25/2016  . GERD (gastroesophageal reflux disease)   . Hyperkalemia   . Hyperlipidemia   . Hypertension   . Impotence   . Pneumonia   . PVC's (premature ventricular contractions)   . Sleep apnea    no CPAP in 15 years    Objective:  Physical Exam General: Alert and oriented x3 in no acute distress  Dermatology: Hyperkeratotic lesions present on the bilateral feet. Pain on palpation with a central nucleated core noted. Skin is warm, dry and supple bilateral lower extremities. Negative for open lesions or macerations. Nails are tender, long, thickened and dystrophic with subungual debris, consistent with onychomycosis, 1-5 bilateral. No signs of infection noted.  Vascular: Palpable pedal pulses bilaterally. No edema or erythema noted. Capillary refill within normal limits.  Neurological: Epicritic and protective threshold diminished bilaterally.   Musculoskeletal Exam: Pain on palpation at the keratotic lesion noted. Range of motion within normal limits bilateral. Muscle strength 5/5 in all groups bilateral.  Assessment: 1. Onychodystrophic nails 1-5 bilateral with hyperkeratosis of nails.  2. Onychomycosis of nail due to dermatophyte bilateral 3. Pre-ulcerative callus lesions noted to the bilateral feet x 2   Plan of Care:  1. Patient evaluated. 2.  Excisional debridement of keratoic lesion using a chisel blade was performed without incident.  3. Dressed with light dressing. 4. Mechanical debridement of nails 1-5 bilaterally performed using a nail nipper. Filed with dremel without incident.  5. Patient is to return to the clinic in 3 months.   Edrick Kins, DPM [facility]  Dr. Edrick Kins, [address]                                        [place], [place] [address]                Office [phone]  Fax [phone]

## 2019-01-14 ENCOUNTER — Ambulatory Visit: Payer: PRIVATE HEALTH INSURANCE | Admitting: Podiatry

## 2019-01-21 ENCOUNTER — Other Ambulatory Visit: Payer: Self-pay

## 2019-01-21 ENCOUNTER — Encounter: Payer: Self-pay | Admitting: Podiatry

## 2019-01-21 ENCOUNTER — Ambulatory Visit (INDEPENDENT_AMBULATORY_CARE_PROVIDER_SITE_OTHER): Payer: Medicare Other | Admitting: Podiatry

## 2019-01-21 DIAGNOSIS — E0842 Diabetes mellitus due to underlying condition with diabetic polyneuropathy: Secondary | ICD-10-CM

## 2019-01-21 DIAGNOSIS — L989 Disorder of the skin and subcutaneous tissue, unspecified: Secondary | ICD-10-CM | POA: Diagnosis not present

## 2019-01-21 DIAGNOSIS — B351 Tinea unguium: Secondary | ICD-10-CM

## 2019-01-21 DIAGNOSIS — M79676 Pain in unspecified toe(s): Secondary | ICD-10-CM

## 2019-01-23 NOTE — Progress Notes (Signed)
    Subjective: Patient is a 83 y.o. male presenting to the office today for follow up evaluation of callus lesions of the bilateral feet. Walking increases the pain. He has not had any recent treatment for the symptoms.   Patient also complains of elongated, thickened nails that cause pain while ambulating in shoes. He is unable to trim his own nails. Patient presents today for further treatment and evaluation.  Past Medical History:  Diagnosis Date  . Anemia   . B12 deficiency   . Bradycardia   . Chronic kidney disease    chronic renal insufficency  . Diabetes mellitus without complication ([place])   . Diverticulosis 01/25/2016  . GERD (gastroesophageal reflux disease)   . Hyperkalemia   . Hyperlipidemia   . Hypertension   . Impotence   . Pneumonia   . PVC's (premature ventricular contractions)   . Sleep apnea    no CPAP in 15 years    Objective:  Physical Exam General: Alert and oriented x3 in no acute distress  Dermatology: Hyperkeratotic lesions present on the bilateral feet. Pain on palpation with a central nucleated core noted. Skin is warm, dry and supple bilateral lower extremities. Negative for open lesions or macerations. Nails are tender, long, thickened and dystrophic with subungual debris, consistent with onychomycosis, 1-5 bilateral. No signs of infection noted.  Vascular: Palpable pedal pulses bilaterally. No edema or erythema noted. Capillary refill within normal limits.  Neurological: Epicritic and protective threshold diminished bilaterally.   Musculoskeletal Exam: Pain on palpation at the keratotic lesion noted. Range of motion within normal limits bilateral. Muscle strength 5/5 in all groups bilateral.  Assessment: 1. Onychodystrophic nails 1-5 bilateral with hyperkeratosis of nails.  2. Onychomycosis of nail due to dermatophyte bilateral 3. Pre-ulcerative callus lesions noted to the bilateral feet x 2   Plan of Care:  1. Patient evaluated. 2.  Excisional debridement of keratoic lesion using a chisel blade was performed without incident.  3. Dressed with light dressing. 4. Mechanical debridement of nails 1-5 bilaterally performed using a nail nipper. Filed with dremel without incident.  5. Patient is to return to the clinic in 3 months.   Edrick Kins, DPM [facility]  Dr. Edrick Kins, [address]                                        [place], [place] [address]                Office [phone]  Fax [phone]

## 2019-04-22 ENCOUNTER — Ambulatory Visit (INDEPENDENT_AMBULATORY_CARE_PROVIDER_SITE_OTHER): Payer: Medicare Other | Admitting: Podiatry

## 2019-04-22 ENCOUNTER — Other Ambulatory Visit: Payer: Self-pay

## 2019-04-22 DIAGNOSIS — E0842 Diabetes mellitus due to underlying condition with diabetic polyneuropathy: Secondary | ICD-10-CM

## 2019-04-22 DIAGNOSIS — M79676 Pain in unspecified toe(s): Secondary | ICD-10-CM | POA: Diagnosis not present

## 2019-04-22 DIAGNOSIS — L989 Disorder of the skin and subcutaneous tissue, unspecified: Secondary | ICD-10-CM | POA: Diagnosis not present

## 2019-04-22 DIAGNOSIS — B351 Tinea unguium: Secondary | ICD-10-CM

## 2019-04-24 NOTE — Progress Notes (Signed)
    Subjective: Patient is a 84 y.o. male presenting to the office today for follow up evaluation of callus lesions of the bilateral feet. Walking increases the pain. He has not had any recent treatment for the symptoms.   Patient also complains of elongated, thickened nails that cause pain while ambulating in shoes. He is unable to trim his own nails. Patient presents today for further treatment and evaluation.  Past Medical History:  Diagnosis Date  . Anemia   . B12 deficiency   . Bradycardia   . Chronic kidney disease    chronic renal insufficency  . Diabetes mellitus without complication (HCC)   . Diverticulosis 01/25/2016  . GERD (gastroesophageal reflux disease)   . Hyperkalemia   . Hyperlipidemia   . Hypertension   . Impotence   . Pneumonia   . PVC's (premature ventricular contractions)   . Sleep apnea    no CPAP in 15 years    Objective:  Physical Exam General: Alert and oriented x3 in no acute distress  Dermatology: Hyperkeratotic lesions present on the bilateral feet. Pain on palpation with a central nucleated core noted. Skin is warm, dry and supple bilateral lower extremities. Negative for open lesions or macerations. Nails are tender, long, thickened and dystrophic with subungual debris, consistent with onychomycosis, 1-5 bilateral. No signs of infection noted.  Vascular: Palpable pedal pulses bilaterally. No edema or erythema noted. Capillary refill within normal limits.  Neurological: Epicritic and protective threshold diminished bilaterally.   Musculoskeletal Exam: Pain on palpation at the keratotic lesion noted. Range of motion within normal limits bilateral. Muscle strength 5/5 in all groups bilateral.  Assessment: 1. Onychodystrophic nails 1-5 bilateral with hyperkeratosis of nails.  2. Onychomycosis of nail due to dermatophyte bilateral 3. Pre-ulcerative callus lesions noted to the bilateral feet x 4   Plan of Care:  1. Patient evaluated. 2.  Excisional debridement of keratoic lesion using a chisel blade was performed without incident.  3. Dressed with light dressing. 4. Mechanical debridement of nails 1-5 bilaterally performed using a nail nipper. Filed with dremel without incident.  5. Patient is to return to the clinic in 3 months.   Felecia Shelling, DPM [facility]  Dr. Felecia Shelling, DPM    [address]                                        [place], [place] [address]                Office [phone]  Fax [phone]

## 2019-07-22 ENCOUNTER — Ambulatory Visit: Payer: Medicare Other | Admitting: Podiatry

## 2019-07-25 ENCOUNTER — Ambulatory Visit (INDEPENDENT_AMBULATORY_CARE_PROVIDER_SITE_OTHER): Payer: Medicare Other | Admitting: Podiatry

## 2019-07-25 ENCOUNTER — Other Ambulatory Visit: Payer: Self-pay

## 2019-07-25 ENCOUNTER — Encounter: Payer: Self-pay | Admitting: Podiatry

## 2019-07-25 DIAGNOSIS — M79676 Pain in unspecified toe(s): Secondary | ICD-10-CM

## 2019-07-25 DIAGNOSIS — L989 Disorder of the skin and subcutaneous tissue, unspecified: Secondary | ICD-10-CM

## 2019-07-25 DIAGNOSIS — B351 Tinea unguium: Secondary | ICD-10-CM | POA: Diagnosis not present

## 2019-07-25 DIAGNOSIS — E0842 Diabetes mellitus due to underlying condition with diabetic polyneuropathy: Secondary | ICD-10-CM

## 2019-07-27 NOTE — Progress Notes (Signed)
    Subjective: Patient is a 84 y.o. male presenting to the office today for follow up evaluation of callus lesions of the bilateral feet. Walking increases the pain. He has not had any recent treatment for the symptoms.   Patient also complains of elongated, thickened nails that cause pain while ambulating in shoes. He is unable to trim his own nails. Patient presents today for further treatment and evaluation.  Past Medical History:  Diagnosis Date  . Anemia   . B12 deficiency   . Bradycardia   . Chronic kidney disease    chronic renal insufficency  . Diabetes mellitus without complication (HCC)   . Diverticulosis 01/25/2016  . GERD (gastroesophageal reflux disease)   . Hyperkalemia   . Hyperlipidemia   . Hypertension   . Impotence   . Pneumonia   . PVC's (premature ventricular contractions)   . Sleep apnea    no CPAP in 15 years    Objective:  Physical Exam General: Alert and oriented x3 in no acute distress  Dermatology: Hyperkeratotic lesions present on the bilateral feet. Pain on palpation with a central nucleated core noted. Skin is warm, dry and supple bilateral lower extremities. Negative for open lesions or macerations. Nails are tender, long, thickened and dystrophic with subungual debris, consistent with onychomycosis, 1-5 bilateral. No signs of infection noted.  Vascular: Palpable pedal pulses bilaterally. No edema or erythema noted. Capillary refill within normal limits.  Neurological: Epicritic and protective threshold diminished bilaterally.   Musculoskeletal Exam: Pain on palpation at the keratotic lesion noted. Range of motion within normal limits bilateral. Muscle strength 5/5 in all groups bilateral.  Assessment: 1. Onychodystrophic nails 1-5 bilateral with hyperkeratosis of nails.  2. Onychomycosis of nail due to dermatophyte bilateral 3. Pre-ulcerative callus lesions noted to the bilateral feet x 4   Plan of Care:  1. Patient evaluated. 2.  Excisional debridement of keratoic lesion using a chisel blade was performed without incident.  3. Dressed with light dressing. 4. Mechanical debridement of nails 1-5 bilaterally performed using a nail nipper. Filed with dremel without incident.  5. Patient is to return to the clinic in 3 months.   Felecia Shelling, DPM [facility]  Dr. Felecia Shelling, DPM    [address]                                        [place], [place] [address]                Office [phone]  Fax [phone]

## 2019-11-11 ENCOUNTER — Other Ambulatory Visit: Payer: Self-pay

## 2019-11-11 ENCOUNTER — Ambulatory Visit (INDEPENDENT_AMBULATORY_CARE_PROVIDER_SITE_OTHER): Payer: Medicare Other | Admitting: Podiatry

## 2019-11-11 ENCOUNTER — Encounter: Payer: Self-pay | Admitting: Podiatry

## 2019-11-11 DIAGNOSIS — B351 Tinea unguium: Secondary | ICD-10-CM | POA: Diagnosis not present

## 2019-11-11 DIAGNOSIS — L989 Disorder of the skin and subcutaneous tissue, unspecified: Secondary | ICD-10-CM | POA: Diagnosis not present

## 2019-11-11 DIAGNOSIS — M79676 Pain in unspecified toe(s): Secondary | ICD-10-CM

## 2019-11-11 DIAGNOSIS — E0842 Diabetes mellitus due to underlying condition with diabetic polyneuropathy: Secondary | ICD-10-CM | POA: Diagnosis not present

## 2019-11-11 NOTE — Progress Notes (Signed)
° ° °  Subjective: Patient is a 84 y.o. male PMHx DM type II presenting to the office today for follow up evaluation of callus lesions of the bilateral feet. Walking increases the pain. He has not had any recent treatment for the symptoms.   Patient also complains of elongated, thickened nails that cause pain while ambulating in shoes. He is unable to trim his own nails. Patient presents today for further treatment and evaluation.  Past Medical History:  Diagnosis Date   Anemia    B12 deficiency    Bradycardia    Chronic kidney disease    chronic renal insufficency   Diabetes mellitus without complication (HCC)    Diverticulosis 01/25/2016   GERD (gastroesophageal reflux disease)    Hyperkalemia    Hyperlipidemia    Hypertension    Impotence    Pneumonia    PVC's (premature ventricular contractions)    Sleep apnea    no CPAP in 15 years    Objective:  Physical Exam General: Alert and oriented x3 in no acute distress  Dermatology: Hyperkeratotic lesions present on the bilateral feet. Pain on palpation with a central nucleated core noted. Skin is warm, dry and supple bilateral lower extremities. Negative for open lesions or macerations. Nails are tender, long, thickened and dystrophic with subungual debris, consistent with onychomycosis, 1-5 bilateral. No signs of infection noted.  Vascular: Palpable pedal pulses bilaterally. No edema or erythema noted. Capillary refill within normal limits.  Neurological: Epicritic and protective threshold diminished bilaterally.   Musculoskeletal Exam: Pain on palpation at the keratotic lesion noted. Range of motion within normal limits bilateral. Muscle strength 5/5 in all groups bilateral.  Hammertoe contracture of the left second toe with overlap onto the hallux.  Assessment: 1. Onychodystrophic nails 1-5 bilateral with hyperkeratosis of nails.  2. Onychomycosis of nail due to dermatophyte bilateral 3. Pre-ulcerative callus  lesions noted to the bilateral feet x 4   Plan of Care:  1. Patient evaluated. 2. Excisional debridement of keratoic lesion using a chisel blade was performed without incident.  3. Dressed with light dressing. 4. Mechanical debridement of nails 1-5 bilaterally performed using a nail nipper. Filed with dremel without incident.  5.  Appointment with Pedorthist for new diabetic shoes and insoles  6.  Patient is to return to the clinic in 3 months.   Felecia Shelling, DPM [facility]  Dr. Felecia Shelling, DPM    [address]                                        [place], [place] [address]                Office [phone]  Fax [phone]

## 2019-11-14 ENCOUNTER — Telehealth: Payer: Self-pay | Admitting: *Deleted

## 2019-11-14 NOTE — Telephone Encounter (Signed)
I attempted to return his call.  I left him a message to call me back. 

## 2019-11-14 NOTE — Telephone Encounter (Signed)
"  Give me a call." 

## 2020-01-21 HISTORY — PX: MOHS SURGERY: SUR867

## 2020-02-09 ENCOUNTER — Other Ambulatory Visit: Payer: Self-pay

## 2020-02-09 ENCOUNTER — Encounter: Payer: Self-pay | Admitting: Ophthalmology

## 2020-02-17 ENCOUNTER — Other Ambulatory Visit
Admission: RE | Admit: 2020-02-17 | Discharge: 2020-02-17 | Disposition: A | Discharge status: Home / Self Care | Payer: Medicare Other | Source: Ambulatory Visit | Attending: Ophthalmology | Admitting: Ophthalmology

## 2020-02-17 DIAGNOSIS — Z7982 Long term (current) use of aspirin: Secondary | ICD-10-CM | POA: Diagnosis not present

## 2020-02-17 DIAGNOSIS — Z79899 Other long term (current) drug therapy: Secondary | ICD-10-CM | POA: Diagnosis not present

## 2020-02-17 DIAGNOSIS — Z882 Allergy status to sulfonamides status: Secondary | ICD-10-CM | POA: Diagnosis not present

## 2020-02-17 DIAGNOSIS — Z833 Family history of diabetes mellitus: Secondary | ICD-10-CM | POA: Diagnosis not present

## 2020-02-17 DIAGNOSIS — H2511 Age-related nuclear cataract, right eye: Secondary | ICD-10-CM | POA: Diagnosis not present

## 2020-02-17 DIAGNOSIS — Z20822 Contact with and (suspected) exposure to covid-19: Secondary | ICD-10-CM | POA: Diagnosis not present

## 2020-02-17 DIAGNOSIS — E1136 Type 2 diabetes mellitus with diabetic cataract: Secondary | ICD-10-CM | POA: Diagnosis present

## 2020-02-17 DIAGNOSIS — Z7984 Long term (current) use of oral hypoglycemic drugs: Secondary | ICD-10-CM | POA: Diagnosis not present

## 2020-02-17 NOTE — Discharge Instructions (Signed)
General Anesthesia, Adult, Care After This sheet gives you information about how to care for yourself after your procedure. Your health care provider may also give you more specific instructions. If you have problems or questions, contact your health care provider. What can I expect after the procedure? After the procedure, the following side effects are common:  Pain or discomfort at the IV site.  Nausea.  Vomiting.  Sore throat.  Trouble concentrating.  Feeling cold or chills.  Weak or tired.  Sleepiness and fatigue.  Soreness and body aches. These side effects can affect parts of the body that were not involved in surgery. Follow these instructions at home:  For at least 24 hours after the procedure:  Have a responsible adult stay with you. It is important to have someone help care for you until you are awake and alert.  Rest as needed.  Do not: ? Participate in activities in which you could fall or become injured. ? Drive. ? Use heavy machinery. ? Drink alcohol. ? Take sleeping pills or medicines that cause drowsiness. ? Make important decisions or sign legal documents. ? Take care of children on your own. Eating and drinking  Follow any instructions from your health care provider about eating or drinking restrictions.  When you feel hungry, start by eating small amounts of foods that are soft and easy to digest (bland), such as toast. Gradually return to your regular diet.  Drink enough fluid to keep your urine pale yellow.  If you vomit, rehydrate by drinking water, juice, or clear broth. General instructions  If you have sleep apnea, surgery and certain medicines can increase your risk for breathing problems. Follow instructions from your health care provider about wearing your sleep device: ? Anytime you are sleeping, including during daytime naps. ? While taking prescription pain medicines, sleeping medicines, or medicines that make you drowsy.  Return to  your normal activities as told by your health care provider. Ask your health care provider what activities are safe for you.  Take over-the-counter and prescription medicines only as told by your health care provider.  If you smoke, do not smoke without supervision.  Keep all follow-up visits as told by your health care provider. This is important. Contact a health care provider if:  You have nausea or vomiting that does not get better with medicine.  You cannot eat or drink without vomiting.  You have pain that does not get better with medicine.  You are unable to pass urine.  You develop a skin rash.  You have a fever.  You have redness around your IV site that gets worse. Get help right away if:  You have difficulty breathing.  You have chest pain.  You have blood in your urine or stool, or you vomit blood. Summary  After the procedure, it is common to have a sore throat or nausea. It is also common to feel tired.  Have a responsible adult stay with you for the first 24 hours after general anesthesia. It is important to have someone help care for you until you are awake and alert.  When you feel hungry, start by eating small amounts of foods that are soft and easy to digest (bland), such as toast. Gradually return to your regular diet.  Drink enough fluid to keep your urine pale yellow.  Return to your normal activities as told by your health care provider. Ask your health care provider what activities are safe for you. This information is not   intended to replace advice given to you by your health care provider. Make sure you discuss any questions you have with your health care provider. Document Revised: 02/09/2017 Document Reviewed: 09/22/2016 Elsevier Patient Education  2020 Elsevier Inc.  Cataract Surgery, Care After This sheet gives you information about how to care for yourself after your procedure. Your health care provider may also give you more specific  instructions. If you have problems or questions, contact your health care provider. What can I expect after the procedure? After the procedure, it is common to have:  Itching.  Discomfort.  Fluid discharge.  Sensitivity to light and to touch.  Bruising in or around the eye.  Mild blurred vision. Follow these instructions at home: Eye care   Do not touch or rub your eyes.  Protect your eyes as told by your health care provider. You may be told to wear a protective eye shield or sunglasses.  Do not put a contact lens into the affected eye or eyes until your health care provider approves.  Keep the area around your eye clean and dry: ? Avoid swimming. ? Do not allow water to hit you directly in the face while showering. ? Keep soap and shampoo out of your eyes.  Check your eye every day for signs of infection. Watch for: ? Redness, swelling, or pain. ? Fluid, blood, or pus. ? Warmth. ? A bad smell. ? Vision that is getting worse. ? Sensitivity that is getting worse. Activity  Do not drive for 24 hours if you were given a sedative during your procedure.  Avoid strenuous activities, such as playing contact sports, for as long as told by your health care provider.  Do not drive or use heavy machinery until your health care provider approves.  Do not bend or lift heavy objects. Bending increases pressure in the eye. You can walk, climb stairs, and do light household chores.  Ask your health care provider when you can return to work. If you work in a dusty environment, you may be advised to wear protective eyewear for a period of time. General instructions  Take or apply over-the-counter and prescription medicines only as told by your health care provider. This includes eye drops.  Keep all follow-up visits as told by your health care provider. This is important. Contact a health care provider if:  You have increased bruising around your eye.  You have pain that is  not helped with medicine.  You have a fever.  You have redness, swelling, or pain in your eye.  You have fluid, blood, or pus coming from your incision.  Your vision gets worse.  Your sensitivity to light gets worse. Get help right away if:  You have sudden loss of vision.  You see flashes of light or spots (floaters).  You have severe eye pain.  You develop nausea or vomiting. Summary  After your procedure, it is common to have itching, discomfort, bruising, fluid discharge, or sensitivity to light.  Follow instructions from your health care provider about caring for your eye after the procedure.  Do not rub your eye after the procedure. You may need to wear eye protection or sunglasses. Do not wear contact lenses. Keep the area around your eye clean and dry.  Avoid activities that require a lot of effort. These include playing sports and lifting heavy objects.  Contact a health care provider if you have increased bruising, pain that does not go away, or a fever. Get help right   away if you suddenly lose your vision, see flashes of light or spots, or have severe pain in the eye. This information is not intended to replace advice given to you by your health care provider. Make sure you discuss any questions you have with your health care provider. Document Revised: 12/03/2018 Document Reviewed: 08/06/2017 Elsevier Patient Education  2020 Elsevier Inc.  

## 2020-02-18 LAB — SARS CORONAVIRUS 2 (TAT 6-24 HRS): SARS Coronavirus 2: NEGATIVE

## 2020-02-19 ENCOUNTER — Ambulatory Visit
Admission: RE | Admit: 2020-02-19 | Discharge: 2020-02-19 | Disposition: A | Discharge status: Home / Self Care | Payer: Medicare Other | Attending: Ophthalmology | Admitting: Ophthalmology

## 2020-02-19 ENCOUNTER — Ambulatory Visit: Payer: Medicare Other | Admitting: Anesthesiology

## 2020-02-19 ENCOUNTER — Encounter: Payer: Self-pay | Admitting: Ophthalmology

## 2020-02-19 ENCOUNTER — Encounter
Admission: RE | Disposition: A | Discharge status: Home / Self Care | Payer: Self-pay | Source: Home / Self Care | Attending: Ophthalmology

## 2020-02-19 ENCOUNTER — Other Ambulatory Visit: Payer: Self-pay

## 2020-02-19 DIAGNOSIS — Z20822 Contact with and (suspected) exposure to covid-19: Secondary | ICD-10-CM | POA: Insufficient documentation

## 2020-02-19 DIAGNOSIS — Z79899 Other long term (current) drug therapy: Secondary | ICD-10-CM | POA: Insufficient documentation

## 2020-02-19 DIAGNOSIS — E1136 Type 2 diabetes mellitus with diabetic cataract: Secondary | ICD-10-CM | POA: Insufficient documentation

## 2020-02-19 DIAGNOSIS — Z7984 Long term (current) use of oral hypoglycemic drugs: Secondary | ICD-10-CM | POA: Insufficient documentation

## 2020-02-19 DIAGNOSIS — Z7982 Long term (current) use of aspirin: Secondary | ICD-10-CM | POA: Insufficient documentation

## 2020-02-19 DIAGNOSIS — Z833 Family history of diabetes mellitus: Secondary | ICD-10-CM | POA: Insufficient documentation

## 2020-02-19 DIAGNOSIS — Z882 Allergy status to sulfonamides status: Secondary | ICD-10-CM | POA: Insufficient documentation

## 2020-02-19 DIAGNOSIS — H2511 Age-related nuclear cataract, right eye: Secondary | ICD-10-CM | POA: Insufficient documentation

## 2020-02-19 HISTORY — DX: Presence of dental prosthetic device (complete) (partial): Z97.2

## 2020-02-19 HISTORY — PX: CATARACT EXTRACTION W/PHACO: SHX586

## 2020-02-19 LAB — GLUCOSE, CAPILLARY
Glucose-Capillary: 141 mg/dL — ABNORMAL HIGH (ref 70–99)
Glucose-Capillary: 142 mg/dL — ABNORMAL HIGH (ref 70–99)

## 2020-02-19 SURGERY — PHACOEMULSIFICATION, CATARACT, WITH IOL INSERTION
Anesthesia: Monitor Anesthesia Care | Site: Eye | Laterality: Right

## 2020-02-19 MED ORDER — SODIUM HYALURONATE 23 MG/ML IO SOLN
INTRAOCULAR | Status: DC | PRN
  Administered 2020-02-19: 0.6 mL via INTRAOCULAR

## 2020-02-19 MED ORDER — OXYCODONE HCL 5 MG PO TABS: 5.0000 mg | ORAL_TABLET | Freq: Once | ORAL | Status: DC | PRN

## 2020-02-19 MED ORDER — SODIUM HYALURONATE 10 MG/ML IO SOLN
INTRAOCULAR | Status: DC | PRN
  Administered 2020-02-19: 0.55 mL via INTRAOCULAR

## 2020-02-19 MED ORDER — MIDAZOLAM HCL 2 MG/2ML IJ SOLN
INTRAMUSCULAR | Status: DC | PRN
  Administered 2020-02-19: 1 mg via INTRAVENOUS

## 2020-02-19 MED ORDER — TETRACAINE HCL 0.5 % OP SOLN
1.0000 [drp] | OPHTHALMIC | Status: DC | PRN
Start: 2020-02-19 — End: 2020-02-19
  Administered 2020-02-19 (×3): 1 [drp] via OPHTHALMIC

## 2020-02-19 MED ORDER — LIDOCAINE HCL (PF) 2 % IJ SOLN
INTRAOCULAR | Status: DC | PRN
  Administered 2020-02-19: 08:00:00 1 mL via INTRAOCULAR

## 2020-02-19 MED ORDER — LACTATED RINGERS IV SOLN: INTRAVENOUS | Status: DC

## 2020-02-19 MED ORDER — ARMC OPHTHALMIC DILATING DROPS
1.0000 | OPHTHALMIC | Status: DC | PRN
Start: 2020-02-19 — End: 2020-02-19
  Administered 2020-02-19 (×3): 1 via OPHTHALMIC

## 2020-02-19 MED ORDER — EPINEPHRINE PF 1 MG/ML IJ SOLN
INTRAOCULAR | Status: DC | PRN
  Administered 2020-02-19: 08:00:00 100 mL via OPHTHALMIC

## 2020-02-19 MED ORDER — MOXIFLOXACIN HCL 0.5 % OP SOLN
OPHTHALMIC | Status: DC | PRN
  Administered 2020-02-19: 0.2 mL via OPHTHALMIC

## 2020-02-19 MED ORDER — FENTANYL CITRATE (PF) 100 MCG/2ML IJ SOLN
INTRAMUSCULAR | Status: DC | PRN
  Administered 2020-02-19: 50 ug via INTRAVENOUS

## 2020-02-19 MED ORDER — OXYCODONE HCL 5 MG/5ML PO SOLN: 5.0000 mg | Freq: Once | ORAL | Status: DC | PRN

## 2020-02-19 SURGICAL SUPPLY — 20 items
CANNULA ANT/CHMB 27G (MISCELLANEOUS) ×2 IMPLANT
CANNULA ANT/CHMB 27GA (MISCELLANEOUS) ×4 IMPLANT
DISSECTOR HYDRO NUCLEUS 50X22 (MISCELLANEOUS) ×2 IMPLANT
GLOVE SURG LX 7.5 STRW (GLOVE) ×2
GLOVE SURG LX STRL 7.5 STRW (GLOVE) ×1 IMPLANT
GLOVE SURG SYN 8.5  E (GLOVE) ×1
GLOVE SURG SYN 8.5 E (GLOVE) ×1 IMPLANT
GLOVE SURG SYN 8.5 PF PI (GLOVE) ×1 IMPLANT
GOWN STRL REUS W/ TWL LRG LVL3 (GOWN DISPOSABLE) ×2 IMPLANT
GOWN STRL REUS W/TWL LRG LVL3 (GOWN DISPOSABLE) ×2
LENS IOL ACRSF IQ PAN 18.0 IMPLANT
LENS IOL IQ PANOPTIX 18.0 ×2 IMPLANT
MARKER SKIN DUAL TIP RULER LAB (MISCELLANEOUS) ×2 IMPLANT
PACK DR. KING ARMS (PACKS) ×2 IMPLANT
PACK EYE AFTER SURG (MISCELLANEOUS) ×2 IMPLANT
PACK OPTHALMIC (MISCELLANEOUS) ×2 IMPLANT
SYR 3ML LL SCALE MARK (SYRINGE) ×2 IMPLANT
SYR TB 1ML LUER SLIP (SYRINGE) ×2 IMPLANT
WATER STERILE IRR 250ML POUR (IV SOLUTION) ×2 IMPLANT
WIPE NON LINTING 3.25X3.25 (MISCELLANEOUS) ×2 IMPLANT

## 2020-02-19 NOTE — Anesthesia Postprocedure Evaluation (Signed)
Anesthesia Post Note  Patient: Jermaine White  Procedure(s) Performed: CATARACT EXTRACTION PHACO AND INTRAOCULAR LENS PLACEMENT (IOC) RIGHT DIABETIC PANOPTIX LENS (Right Eye)     Patient location during evaluation: PACU Anesthesia Type: MAC Level of consciousness: awake and alert Pain management: pain level controlled Vital Signs Assessment: post-procedure vital signs reviewed and stable Respiratory status: spontaneous breathing Cardiovascular status: stable Anesthetic complications: no   No complications documented.  Gillian Scarce

## 2020-02-19 NOTE — Anesthesia Preprocedure Evaluation (Signed)
Anesthesia Evaluation  Patient identified by MRN, date of birth, ID band Patient awake    Reviewed: Allergy & Precautions, H&P , NPO status , Patient's Chart, lab work & pertinent test results  Airway Mallampati: II  TM Distance: >3 FB Neck ROM: full    Dental no notable dental hx.    Pulmonary sleep apnea ,    Pulmonary exam normal        Cardiovascular hypertension, On Medications Normal cardiovascular exam Rhythm:regular Rate:Normal     Neuro/Psych    GI/Hepatic Neg liver ROS, Medicated,  Endo/Other  diabetes, Well Controlled  Renal/GU      Musculoskeletal   Abdominal   Peds  Hematology  (+) Blood dyscrasia, anemia ,   Anesthesia Other Findings   Reproductive/Obstetrics                             Anesthesia Physical Anesthesia Plan  ASA: II  Anesthesia Plan: MAC   Post-op Pain Management:    Induction:   PONV Risk Score and Plan: 1 and Treatment may vary due to age or medical condition  Airway Management Planned:   Additional Equipment:   Intra-op Plan:   Post-operative Plan:   Informed Consent: I have reviewed the patients History and Physical, chart, labs and discussed the procedure including the risks, benefits and alternatives for the proposed anesthesia with the patient or authorized representative who has indicated his/her understanding and acceptance.       Plan Discussed with:   Anesthesia Plan Comments:         Anesthesia Quick Evaluation

## 2020-02-19 NOTE — H&P (Signed)
[facility]   Primary Care Physician:  Leim Fabry, MD Ophthalmologist: Dr. Willey Blade  Pre-Procedure History & Physical: HPI:  Jermaine White is a 84 y.o. male here for cataract surgery.   Past Medical History:  Diagnosis Date  . Anemia   . B12 deficiency   . Bradycardia   . Chronic kidney disease    chronic renal insufficency  . Diabetes mellitus without complication (HCC)   . Diverticulosis 01/25/2016  . GERD (gastroesophageal reflux disease)   . Hyperkalemia   . Hyperlipidemia   . Hypertension   . Impotence   . Pneumonia   . PVC's (premature ventricular contractions)   . Sleep apnea    no CPAP in 15 years  . Wears dentures    partial lower    Past Surgical History:  Procedure Laterality Date  . BACK SURGERY  10/18/2016  . COLONOSCOPY    . COLONOSCOPY WITH PROPOFOL N/A 01/25/2016   Procedure: COLONOSCOPY WITH PROPOFOL;  Surgeon: Christena Deem, MD;  Location: [facility] ENDOSCOPY;  Service: Endoscopy;  Laterality: N/A;  . HERNIA REPAIR     x3  . KNEE ARTHROSCOPY Left 03/26/2017   Procedure: ARTHROSCOPY KNEE, PARTIAL MEDIAL MENISECTOMY, CHONDROPLASTY;  Surgeon: Donato Heinz, MD;  Location: [facility] ORS;  Service: Orthopedics;  Laterality: Left;  . LUMBAR LAMINECTOMY/DECOMPRESSION MICRODISCECTOMY Left 10/18/2016   Procedure: LAMINECTOMY AND FORAMINOTOMY LEFT LUMBAR THREE- LUMBAR FOUR, LUMBAR FOUR- LUMBAR FIVE;  Surgeon: Tressie Stalker, MD;  Location: [facility] OR;  Service: Neurosurgery;  Laterality: Left;  . MOHS SURGERY Right 01/2020   Forearm, just below elbow    Prior to Admission medications   Medication Sig Start Date End Date Taking? Authorizing Provider  Black Elderberry 50 MG/5ML SYRP Take by mouth daily.   Yes [provider]  glipiZIDE (GLUCOTROL XL) 5 MG 24 hr tablet Take 5 mg by mouth 2 (two) times daily.  03/16/16 02/19/20 Yes [provider]  Glucosamine-Chondroitin (COSAMIN DS PO) Take 1 tablet by mouth 2 (two) times daily.   Yes  [provider]  hydrochlorothiazide (HYDRODIURIL) 12.5 MG tablet  06/01/18  Yes [provider]  lisinopril (ZESTRIL) 5 MG tablet  06/21/18  Yes [provider]  lovastatin (MEVACOR) 40 MG tablet Take 40 mg by mouth at bedtime.  03/05/16  Yes [provider]  metFORMIN (GLUCOPHAGE-XR) 500 MG 24 hr tablet Take 500 mg by mouth 2 (two) times daily.    Yes [provider]  Multiple Vitamins-Minerals (ZINC PO) Take by mouth daily.   Yes [provider]  vitamin B-12 (CYANOCOBALAMIN) 1000 MCG tablet Take 1,000 mcg by mouth daily.   Yes [provider]  ACCU-CHEK COMPACT PLUS test strip CHECK BLOOD SUGAR TWICE A DAY. (DX E11.29) 04/14/15   [provider]  aspirin EC 81 MG tablet Take 81 mg by mouth at bedtime. Patient not taking: Reported on 02/09/2020    [provider]  calcium carbonate (TUMS - DOSED IN MG ELEMENTAL CALCIUM) 500 MG chewable tablet Chew 1-2 tablets by mouth 2 (two) times daily as needed for indigestion or heartburn.  Patient not taking: Reported on 02/09/2020    [provider]  gentamicin cream (GARAMYCIN) 0.1 % Apply 1 application topically 2 (two) times daily. 04/16/18   Felecia Shelling, DPM  glucose blood (ACCU-CHEK COMPACT PLUS) test strip CHECK BLOOD SUGAR TWICE A DAY. (DX E11.29) 02/23/16   [provider]    Allergies as of 12/30/2019 - Review Complete 11/11/2019  Allergen  Reaction Noted  . Sulfa antibiotics Other (See Comments) 05/05/2015    Family History  Problem Relation Age of Onset  . Diabetes Mother   . Heart disease Father     Social History   Socioeconomic History  . Marital status: Married    Spouse name: Not on file  . Number of children: Not on file  . Years of education: Not on file  . Highest education level: Not on file  Occupational History  . Not on file  Tobacco Use  . Smoking status: Never Smoker  . Smokeless tobacco: Never Used  Vaping Use  .  Vaping Use: Never used  Substance and Sexual Activity  . Alcohol use: No    Alcohol/week: 0.0 standard drinks  . Drug use: No  . Sexual activity: Not on file  Other Topics Concern  . Not on file  Social History Narrative  . Not on file   Social Determinants of Health   Financial Resource Strain: Not on file  Food Insecurity: Not on file  Transportation Needs: Not on file  Physical Activity: Not on file  Stress: Not on file  Social Connections: Not on file  Intimate Partner Violence: Not on file    Review of Systems: See HPI, otherwise negative ROS  Physical Exam: BP (!) 143/90   Pulse 75   Temp (!) 97.4 F (36.3 C) (Temporal)   Resp 16   Ht 5\' 8"  (1.727 m)   Wt 84.8 kg   SpO2 98%   BMI 28.43 kg/m  General:   Alert,  pleasant and cooperative in NAD Head:  Normocephalic and atraumatic. Respiratory:  Normal work of breathing.  Impression/Plan: Jermaine White is here for cataract surgery.  Risks, benefits, limitations, and alternatives regarding cataract surgery have been reviewed with the patient.  Questions have been answered.  All parties agreeable.   Randa Lynn, MD  02/19/2020, 7:16 AM

## 2020-02-19 NOTE — Anesthesia Procedure Notes (Signed)
Procedure Name: MAC Date/Time: 02/19/2020 7:37 AM Performed by: Cameron Ali, CRNA Pre-anesthesia Checklist: Patient identified, Emergency Drugs available, Suction available, Timeout performed and Patient being monitored Patient Re-evaluated:Patient Re-evaluated prior to induction Oxygen Delivery Method: Nasal cannula Placement Confirmation: positive ETCO2

## 2020-02-19 NOTE — Op Note (Signed)
OPERATIVE NOTE  DOMANICK CUCCIA [id number] 02/19/2020   PREOPERATIVE DIAGNOSIS:  Nuclear sclerotic cataract right eye.  H25.11   POSTOPERATIVE DIAGNOSIS:    Nuclear sclerotic cataract right eye.     PROCEDURE:  Phacoemusification with posterior chamber intraocular lens placement of the right eye   LENS:   Implant Name Type Inv. Item Serial No. Manufacturer Lot No. LRB No. Used Action  LENS IOL IQ PANOPTIX 18.0 - [id number]  LENS IOL IQ PANOPTIX 18.0 [id number] ALCON  Right 1 Implanted       Procedure(s) with comments: CATARACT EXTRACTION PHACO AND INTRAOCULAR LENS PLACEMENT (IOC) RIGHT DIABETIC PANOPTIX LENS (Right) - 5.89 0:41.5  TFNT00   ULTRASOUND TIME: 0 minutes 41 seconds.  CDE 5.89   SURGEON:  Willey Blade, MD, MPH  ANESTHESIOLOGIST: Anesthesiologist: Jolayne Panther, MD CRNA: Maree Krabbe, CRNA   ANESTHESIA:  Topical with tetracaine drops augmented with 1% preservative-free intracameral lidocaine.  ESTIMATED BLOOD LOSS: less than 1 mL.   COMPLICATIONS:  None.   DESCRIPTION OF PROCEDURE:  The patient was identified in the holding room and transported to the operating room and placed in the supine position under the operating microscope.  The right eye was identified as the operative eye and it was prepped and draped in the usual sterile ophthalmic fashion.   A 1.0 millimeter clear-corneal paracentesis was made at the 10:30 position. 0.5 ml of preservative-free 1% lidocaine with epinephrine was injected into the anterior chamber.  The anterior chamber was filled with Healon 5 viscoelastic.  A 2.4 millimeter keratome was used to make a near-clear corneal incision at the 8:00 position.  A curvilinear capsulorrhexis was made with a cystotome and capsulorrhexis forceps.  Balanced salt solution was used to hydrodissect and hydrodelineate the nucleus.   Phacoemulsification was then used in stop and chop fashion to remove the lens nucleus and epinucleus.  The remaining  cortex was then removed using the irrigation and aspiration handpiece. Healon was then placed into the capsular bag to distend it for lens placement.  A lens was then injected into the capsular bag.  The remaining viscoelastic was aspirated.   Wounds were hydrated with balanced salt solution.  The anterior chamber was inflated to a physiologic pressure with balanced salt solution.   Intracameral vigamox 0.1 mL undiluted was injected into the eye and a drop placed onto the ocular surface.  No wound leaks were noted.  The patient was taken to the recovery room in stable condition without complications of anesthesia or surgery  Willey Blade 02/19/2020, 8:00 AM

## 2020-02-19 NOTE — Transfer of Care (Signed)
Immediate Anesthesia Transfer of Care Note  Patient: Jermaine White  Procedure(s) Performed: CATARACT EXTRACTION PHACO AND INTRAOCULAR LENS PLACEMENT (IOC) RIGHT DIABETIC PANOPTIX LENS (Right Eye)  Patient Location: PACU  Anesthesia Type: MAC  Level of Consciousness: awake, alert  and patient cooperative  Airway and Oxygen Therapy: Patient Spontanous Breathing and Patient connected to supplemental oxygen  Post-op Assessment: Post-op Vital signs reviewed, Patient's Cardiovascular Status Stable, Respiratory Function Stable, Patent Airway and No signs of Nausea or vomiting  Post-op Vital Signs: Reviewed and stable  Complications: No complications documented.

## 2020-02-23 ENCOUNTER — Encounter: Payer: Self-pay | Admitting: Ophthalmology

## 2020-02-23 ENCOUNTER — Other Ambulatory Visit: Payer: Self-pay

## 2020-02-26 ENCOUNTER — Other Ambulatory Visit: Payer: Self-pay

## 2020-02-26 ENCOUNTER — Other Ambulatory Visit
Admission: RE | Admit: 2020-02-26 | Discharge: 2020-02-26 | Disposition: A | Discharge status: Home / Self Care | Payer: Medicare Other | Source: Ambulatory Visit | Attending: Ophthalmology | Admitting: Ophthalmology

## 2020-02-26 DIAGNOSIS — Z01812 Encounter for preprocedural laboratory examination: Secondary | ICD-10-CM | POA: Diagnosis present

## 2020-02-26 DIAGNOSIS — Z20822 Contact with and (suspected) exposure to covid-19: Secondary | ICD-10-CM | POA: Diagnosis not present

## 2020-02-26 LAB — SARS CORONAVIRUS 2 (TAT 6-24 HRS): SARS Coronavirus 2: NEGATIVE

## 2020-02-26 NOTE — Discharge Instructions (Signed)

## 2020-03-01 ENCOUNTER — Ambulatory Visit: Payer: Medicare Other | Admitting: Anesthesiology

## 2020-03-01 ENCOUNTER — Other Ambulatory Visit: Payer: Self-pay

## 2020-03-01 ENCOUNTER — Ambulatory Visit
Admission: RE | Admit: 2020-03-01 | Discharge: 2020-03-01 | Disposition: A | Discharge status: Home / Self Care | Payer: Medicare Other | Attending: Ophthalmology | Admitting: Ophthalmology

## 2020-03-01 ENCOUNTER — Encounter: Payer: Self-pay | Admitting: Ophthalmology

## 2020-03-01 ENCOUNTER — Encounter
Admission: RE | Disposition: A | Discharge status: Home / Self Care | Payer: Self-pay | Source: Home / Self Care | Attending: Ophthalmology

## 2020-03-01 DIAGNOSIS — H2512 Age-related nuclear cataract, left eye: Secondary | ICD-10-CM | POA: Diagnosis not present

## 2020-03-01 DIAGNOSIS — Z79899 Other long term (current) drug therapy: Secondary | ICD-10-CM | POA: Insufficient documentation

## 2020-03-01 DIAGNOSIS — Z7982 Long term (current) use of aspirin: Secondary | ICD-10-CM | POA: Insufficient documentation

## 2020-03-01 DIAGNOSIS — Z7984 Long term (current) use of oral hypoglycemic drugs: Secondary | ICD-10-CM | POA: Diagnosis not present

## 2020-03-01 HISTORY — PX: CATARACT EXTRACTION W/PHACO: SHX586

## 2020-03-01 LAB — GLUCOSE, CAPILLARY
Glucose-Capillary: 131 mg/dL — ABNORMAL HIGH (ref 70–99)
Glucose-Capillary: 156 mg/dL — ABNORMAL HIGH (ref 70–99)

## 2020-03-01 SURGERY — PHACOEMULSIFICATION, CATARACT, WITH IOL INSERTION
Anesthesia: Monitor Anesthesia Care | Site: Eye | Laterality: Left

## 2020-03-01 MED ORDER — ACETAMINOPHEN 325 MG PO TABS: 325.0000 mg | ORAL_TABLET | Freq: Once | ORAL | Status: DC

## 2020-03-01 MED ORDER — SODIUM HYALURONATE 10 MG/ML IO SOLN
INTRAOCULAR | Status: DC | PRN
  Administered 2020-03-01: 0.55 mL via INTRAOCULAR

## 2020-03-01 MED ORDER — FENTANYL CITRATE (PF) 100 MCG/2ML IJ SOLN
INTRAMUSCULAR | Status: DC | PRN
  Administered 2020-03-01: 50 ug via INTRAVENOUS

## 2020-03-01 MED ORDER — SODIUM HYALURONATE 23 MG/ML IO SOLN
INTRAOCULAR | Status: DC | PRN
  Administered 2020-03-01: 0.6 mL via INTRAOCULAR

## 2020-03-01 MED ORDER — TETRACAINE HCL 0.5 % OP SOLN
1.0000 [drp] | OPHTHALMIC | Status: DC | PRN
  Administered 2020-03-01 (×3): 1 [drp] via OPHTHALMIC

## 2020-03-01 MED ORDER — ACETAMINOPHEN 160 MG/5ML PO SOLN: 325.0000 mg | Freq: Once | ORAL | Status: DC

## 2020-03-01 MED ORDER — MIDAZOLAM HCL 2 MG/2ML IJ SOLN
INTRAMUSCULAR | Status: DC | PRN
  Administered 2020-03-01: 1 mg via INTRAVENOUS

## 2020-03-01 MED ORDER — LACTATED RINGERS IV SOLN: INTRAVENOUS | Status: DC

## 2020-03-01 MED ORDER — LIDOCAINE HCL (PF) 2 % IJ SOLN
INTRAOCULAR | Status: DC | PRN
  Administered 2020-03-01: 1 mL via INTRAOCULAR

## 2020-03-01 MED ORDER — ARMC OPHTHALMIC DILATING DROPS
1.0000 "application " | OPHTHALMIC | Status: DC | PRN
  Administered 2020-03-01 (×3): 1 via OPHTHALMIC

## 2020-03-01 MED ORDER — EPINEPHRINE PF 1 MG/ML IJ SOLN
INTRAOCULAR | Status: DC | PRN
  Administered 2020-03-01: 73 mL via OPHTHALMIC

## 2020-03-01 MED ORDER — MOXIFLOXACIN HCL 0.5 % OP SOLN
OPHTHALMIC | Status: DC | PRN
  Administered 2020-03-01: 0.2 mL via OPHTHALMIC

## 2020-03-01 SURGICAL SUPPLY — 21 items
CANNULA ANT/CHMB 27G (MISCELLANEOUS) ×2 IMPLANT
CANNULA ANT/CHMB 27GA (MISCELLANEOUS) ×4 IMPLANT
DISSECTOR HYDRO NUCLEUS 50X22 (MISCELLANEOUS) ×2 IMPLANT
GLOVE SURG LX 7.5 STRW (GLOVE) ×2
GLOVE SURG LX STRL 7.5 STRW (GLOVE) ×1 IMPLANT
GLOVE SURG SYN 8.5  E (GLOVE) ×1
GLOVE SURG SYN 8.5 E (GLOVE) ×1 IMPLANT
GLOVE SURG SYN 8.5 PF PI (GLOVE) ×1 IMPLANT
GOWN STRL REUS W/ TWL LRG LVL3 (GOWN DISPOSABLE) ×2 IMPLANT
GOWN STRL REUS W/TWL LRG LVL3 (GOWN DISPOSABLE) ×4
LENS IOL IQ PAN TRC 30 17.0 IMPLANT
LENS IOL PANOP TORIC 30 17.0 ×1 IMPLANT
LENS IOL PANOPTIX TORIC 17.0 ×2 IMPLANT
MARKER SKIN DUAL TIP RULER LAB (MISCELLANEOUS) ×2 IMPLANT
PACK DR. KING ARMS (PACKS) ×2 IMPLANT
PACK EYE AFTER SURG (MISCELLANEOUS) ×2 IMPLANT
PACK OPTHALMIC (MISCELLANEOUS) ×2 IMPLANT
SYR 3ML LL SCALE MARK (SYRINGE) ×2 IMPLANT
SYR TB 1ML LUER SLIP (SYRINGE) ×2 IMPLANT
WATER STERILE IRR 250ML POUR (IV SOLUTION) ×2 IMPLANT
WIPE NON LINTING 3.25X3.25 (MISCELLANEOUS) ×2 IMPLANT

## 2020-03-01 NOTE — Anesthesia Procedure Notes (Signed)
Procedure Name: MAC Date/Time: 03/01/2020 7:42 AM Performed by: Jeannene Patella, CRNA Pre-anesthesia Checklist: Patient identified, Emergency Drugs available, Suction available, Timeout performed and Patient being monitored Patient Re-evaluated:Patient Re-evaluated prior to induction Oxygen Delivery Method: Nasal cannula Placement Confirmation: positive ETCO2

## 2020-03-01 NOTE — Op Note (Signed)
OPERATIVE NOTE  Jermaine White [id number] 03/01/2020   PREOPERATIVE DIAGNOSIS:  Nuclear sclerotic cataract left eye.  H25.12   POSTOPERATIVE DIAGNOSIS:    Nuclear sclerotic cataract left eye.     PROCEDURE:  Phacoemusification with posterior chamber intraocular lens placement of the left eye   LENS:   Implant Name Type Inv. Item Serial No. Manufacturer Lot No. LRB No. Used Action  LENS IOL PANOPTIX TORIC 17.0 - [id number]  LENS IOL PANOPTIX TORIC 17.0 [id number] ALCON  Left 1 Implanted      Procedure(s) with comments: CATARACT EXTRACTION PHACO AND INTRAOCULAR LENS PLACEMENT (IOC) LEFT DIABETIC PANOPTIX LENS (Left) - 8.61 0:51.2  TFNT30 +17.0 @112    ULTRASOUND TIME: 0 minutes 51 seconds.  CDE 8.61   SURGEON:  , MD, MPH   ANESTHESIA:  Topical with tetracaine drops augmented with 1% preservative-free intracameral lidocaine.  ESTIMATED BLOOD LOSS: <1 mL   COMPLICATIONS:  None.   DESCRIPTION OF PROCEDURE:  The patient was identified in the holding room and transported to the operating room and placed in the supine position under the operating microscope.  The left eye was identified as the operative eye and it was prepped and draped in the usual sterile ophthalmic fashion.  The verion system was registered without difficulty.   A 1.0 millimeter clear-corneal paracentesis was made at the 5:00 position. 0.5 ml of preservative-free 1% lidocaine with epinephrine was injected into the anterior chamber.  The anterior chamber was filled with Healon 5 viscoelastic.  A 2.4 millimeter keratome was used to make a near-clear corneal incision at the 2:00 position.  A curvilinear capsulorrhexis was made with a cystotome and capsulorrhexis forceps.  Balanced salt solution was used to hydrodissect and hydrodelineate the nucleus.   Phacoemulsification was then used in stop and chop fashion to remove the lens nucleus and epinucleus.  The remaining cortex was then removed using the  irrigation and aspiration handpiece. Healon was then placed into the capsular bag to distend it for lens placement.  A lens was then injected into the capsular bag.  The remaining viscoelastic was aspirated.  The lens was rotated with guidance from the verion system to 112.   Wounds were hydrated with balanced salt solution.  The anterior chamber was inflated to a physiologic pressure with balanced salt solution.  Intracameral vigamox 0.1 mL undiltued was injected into the eye and a drop placed onto the ocular surface.  No wound leaks were noted.  The patient was taken to the recovery room in stable condition without complications of anesthesia or surgery  Willey Blade 03/01/2020, 8:02 AM

## 2020-03-01 NOTE — Anesthesia Postprocedure Evaluation (Signed)
Anesthesia Post Note  Patient: Jermaine White  Procedure(s) Performed: CATARACT EXTRACTION PHACO AND INTRAOCULAR LENS PLACEMENT (IOC) LEFT DIABETIC PANOPTIX LENS (Left Eye)     Patient location during evaluation: PACU Anesthesia Type: MAC Level of consciousness: awake and alert and oriented Pain management: satisfactory to patient Vital Signs Assessment: post-procedure vital signs reviewed and stable Respiratory status: spontaneous breathing, nonlabored ventilation and respiratory function stable Cardiovascular status: blood pressure returned to baseline and stable Postop Assessment: Adequate PO intake and No signs of nausea or vomiting Anesthetic complications: no   No complications documented.  Raliegh Ip

## 2020-03-01 NOTE — Anesthesia Preprocedure Evaluation (Signed)
Anesthesia Evaluation  Patient identified by MRN, date of birth, ID band Patient awake    Reviewed: Allergy & Precautions, H&P , NPO status , Patient's Chart, lab work & pertinent test results  Airway Mallampati: II  TM Distance: >3 FB Neck ROM: full    Dental no notable dental hx.    Pulmonary sleep apnea ,    Pulmonary exam normal breath sounds clear to auscultation       Cardiovascular hypertension, On Medications Normal cardiovascular exam Rhythm:regular Rate:Normal     Neuro/Psych    GI/Hepatic Neg liver ROS,   Endo/Other  diabetes, Well Controlled  Renal/GU      Musculoskeletal   Abdominal   Peds  Hematology  (+) Blood dyscrasia, anemia ,   Anesthesia Other Findings   Reproductive/Obstetrics                             Anesthesia Physical  Anesthesia Plan  ASA: II  Anesthesia Plan: MAC   Post-op Pain Management:    Induction:   PONV Risk Score and Plan: 1 and Treatment may vary due to age or medical condition, Midazolam and TIVA  Airway Management Planned:   Additional Equipment:   Intra-op Plan:   Post-operative Plan:   Informed Consent: I have reviewed the patients History and Physical, chart, labs and discussed the procedure including the risks, benefits and alternatives for the proposed anesthesia with the patient or authorized representative who has indicated his/her understanding and acceptance.     Dental Advisory Given  Plan Discussed with: CRNA  Anesthesia Plan Comments:         Anesthesia Quick Evaluation

## 2020-03-01 NOTE — Transfer of Care (Signed)
Immediate Anesthesia Transfer of Care Note  Patient: Jermaine White  Procedure(s) Performed: CATARACT EXTRACTION PHACO AND INTRAOCULAR LENS PLACEMENT (IOC) LEFT DIABETIC PANOPTIX LENS (Left Eye)  Patient Location: PACU  Anesthesia Type: MAC  Level of Consciousness: awake, alert  and patient cooperative  Airway and Oxygen Therapy: Patient Spontanous Breathing and Patient connected to supplemental oxygen  Post-op Assessment: Post-op Vital signs reviewed, Patient's Cardiovascular Status Stable, Respiratory Function Stable, Patent Airway and No signs of Nausea or vomiting  Post-op Vital Signs: Reviewed and stable  Complications: No complications documented.

## 2020-03-01 NOTE — H&P (Signed)
[facility]   Primary Care Physician:  Leim Fabry, MD Ophthalmologist: Dr. Willey Blade  Pre-Procedure History & Physical: HPI:  Jermaine White is a 85 y.o. male here for cataract surgery.   Past Medical History:  Diagnosis Date  . Anemia   . B12 deficiency   . Bradycardia   . Chronic kidney disease    chronic renal insufficency  . Diabetes mellitus without complication (HCC)   . Diverticulosis 01/25/2016  . GERD (gastroesophageal reflux disease)   . Hyperkalemia   . Hyperlipidemia   . Hypertension   . Impotence   . Pneumonia   . PVC's (premature ventricular contractions)   . Sleep apnea    no CPAP in 15 years  . Wears dentures    partial lower    Past Surgical History:  Procedure Laterality Date  . BACK SURGERY  10/18/2016  . CATARACT EXTRACTION W/PHACO Right 02/19/2020   Procedure: CATARACT EXTRACTION PHACO AND INTRAOCULAR LENS PLACEMENT (IOC) RIGHT DIABETIC PANOPTIX LENS;  Surgeon: Nevada Crane, MD;  Location: [facility];  Service: Ophthalmology;  Laterality: Right;  5.89 0:41.5  . COLONOSCOPY    . COLONOSCOPY WITH PROPOFOL N/A 01/25/2016   Procedure: COLONOSCOPY WITH PROPOFOL;  Surgeon: Christena Deem, MD;  Location: [facility] END[facility]COPY;  Service: Endoscopy;  Laterality: N/A;  . HERNIA REPAIR     x3  . KNEE ARTHROSCOPY Left 03/26/2017   Procedure: ARTHROSCOPY KNEE, PARTIAL MEDIAL MENISECTOMY, CHONDROPLASTY;  Surgeon: Donato Heinz, MD;  Location: [facility] ORS;  Service: Orthopedics;  Laterality: Left;  . LUMBAR LAMINECTOMY/DECOMPRESSION MICRODISCECTOMY Left 10/18/2016   Procedure: LAMINECTOMY AND FORAMINOTOMY LEFT LUMBAR THREE- LUMBAR FOUR, LUMBAR FOUR- LUMBAR FIVE;  Surgeon: Tressie Stalker, MD;  Location: [facility] OR;  Service: Neurosurgery;  Laterality: Left;  . MOHS SURGERY Right 01/2020   Forearm, just below elbow    Prior to Admission medications   Medication Sig Start Date End Date Taking? Authorizing Provider  Black Elderberry 50 MG/5ML  SYRP Take by mouth daily.   Yes [provider]  glipiZIDE (GLUCOTROL XL) 5 MG 24 hr tablet Take 5 mg by mouth 2 (two) times daily.  03/16/16 03/01/20 Yes [provider]  Glucosamine-Chondroitin (COSAMIN DS PO) Take 1 tablet by mouth 2 (two) times daily.   Yes [provider]  hydrochlorothiazide (HYDRODIURIL) 12.5 MG tablet  06/01/18  Yes [provider]  lisinopril (ZESTRIL) 5 MG tablet  06/21/18  Yes [provider]  lovastatin (MEVACOR) 40 MG tablet Take 40 mg by mouth at bedtime.  03/05/16  Yes [provider]  metFORMIN (GLUCOPHAGE-XR) 500 MG 24 hr tablet Take 500 mg by mouth 2 (two) times daily.    Yes [provider]  Multiple Vitamins-Minerals (ZINC PO) Take by mouth daily.   Yes [provider]  vitamin B-12 (CYANOCOBALAMIN) 1000 MCG tablet Take 1,000 mcg by mouth daily.   Yes [provider]  ACCU-CHEK COMPACT PLUS test strip CHECK BLOOD SUGAR TWICE A DAY. (DX E11.29) 04/14/15   [provider]  aspirin EC 81 MG tablet Take 81 mg by mouth at bedtime. Patient not taking: Reported on 02/09/2020    [provider]  calcium carbonate (TUMS - DOSED IN MG ELEMENTAL CALCIUM) 500 MG chewable tablet Chew 1-2 tablets by mouth 2 (two) times daily as needed for indigestion or heartburn.  Patient not taking: Reported on 02/09/2020    [provider]  gentamicin cream (GARAMYCIN) 0.1 % Apply 1 application topically 2 (two) times daily.  04/16/18   Felecia Shelling, DPM  glucose blood (ACCU-CHEK COMPACT PLUS) test strip CHECK BLOOD SUGAR TWICE A DAY. (DX E11.29) 02/23/16   [provider]    Allergies as of 02/23/2020 - Review Complete 02/23/2020  Allergen Reaction Noted  . Sulfa antibiotics Other (See Comments) 05/05/2015    Family History  Problem Relation Age of Onset  . Diabetes Mother   . Heart disease Father     Social History   Socioeconomic History  . Marital status: Married     Spouse name: Not on file  . Number of children: Not on file  . Years of education: Not on file  . Highest education level: Not on file  Occupational History  . Not on file  Tobacco Use  . Smoking status: Never Smoker  . Smokeless tobacco: Never Used  Vaping Use  . Vaping Use: Never used  Substance and Sexual Activity  . Alcohol use: No    Alcohol/week: 0.0 standard drinks  . Drug use: No  . Sexual activity: Not on file  Other Topics Concern  . Not on file  Social History Narrative  . Not on file   Social Determinants of Health   Financial Resource Strain: Not on file  Food Insecurity: Not on file  Transportation Needs: Not on file  Physical Activity: Not on file  Stress: Not on file  Social Connections: Not on file  Intimate Partner Violence: Not on file    Review of Systems: See HPI, otherwise negative ROS  Physical Exam: BP 135/79   Pulse 74   Temp (!) 97.3 F (36.3 C) (Temporal)   Ht 5\' 8"  (1.727 m)   Wt 87.1 kg   SpO2 98%   BMI 29.19 kg/m  General:   Alert,  pleasant and cooperative in NAD Head:  Normocephalic and atraumatic. Respiratory:  Normal work of breathing.  Impression/Plan: Jermaine White is here for cataract surgery.  Risks, benefits, limitations, and alternatives regarding cataract surgery have been reviewed with the patient.  Questions have been answered.  All parties agreeable.   Randa Lynn, MD  03/01/2020, 7:17 AM

## 2020-03-02 ENCOUNTER — Encounter: Payer: Self-pay | Admitting: Ophthalmology

## 2020-04-30 ENCOUNTER — Other Ambulatory Visit: Payer: Self-pay

## 2020-04-30 ENCOUNTER — Encounter: Payer: Self-pay | Admitting: Podiatry

## 2020-04-30 ENCOUNTER — Ambulatory Visit (INDEPENDENT_AMBULATORY_CARE_PROVIDER_SITE_OTHER): Payer: Medicare Other | Admitting: Podiatry

## 2020-04-30 DIAGNOSIS — E0842 Diabetes mellitus due to underlying condition with diabetic polyneuropathy: Secondary | ICD-10-CM | POA: Diagnosis not present

## 2020-04-30 DIAGNOSIS — L989 Disorder of the skin and subcutaneous tissue, unspecified: Secondary | ICD-10-CM

## 2020-04-30 DIAGNOSIS — B351 Tinea unguium: Secondary | ICD-10-CM

## 2020-04-30 DIAGNOSIS — M79676 Pain in unspecified toe(s): Secondary | ICD-10-CM | POA: Diagnosis not present

## 2020-04-30 NOTE — Progress Notes (Signed)
    Subjective: Patient is a 85 y.o. male PMHx DM type II presenting to the office today for follow up evaluation of callus lesions of the bilateral feet. Walking increases the pain. He has not had any recent treatment for the symptoms.   Patient also complains of elongated, thickened nails that cause pain while ambulating in shoes. He is unable to trim his own nails. Patient presents today for further treatment and evaluation.  Past Medical History:  Diagnosis Date  . Anemia   . B12 deficiency   . Bradycardia   . Chronic kidney disease    chronic renal insufficency  . Diabetes mellitus without complication (HCC)   . Diverticulosis 01/25/2016  . GERD (gastroesophageal reflux disease)   . Hyperkalemia   . Hyperlipidemia   . Hypertension   . Impotence   . Pneumonia   . PVC's (premature ventricular contractions)   . Sleep apnea    no CPAP in 15 years  . Wears dentures    partial lower    Objective:  Physical Exam General: Alert and oriented x3 in no acute distress  Dermatology: Hyperkeratotic lesions present on the bilateral feet. Pain on palpation with a central nucleated core noted. Skin is warm, dry and supple bilateral lower extremities. Negative for open lesions or macerations. Nails are tender, long, thickened and dystrophic with subungual debris, consistent with onychomycosis, 1-5 bilateral. No signs of infection noted.  Vascular: Palpable pedal pulses bilaterally. No edema or erythema noted. Capillary refill within normal limits.  Neurological: Epicritic and protective threshold diminished bilaterally.   Musculoskeletal Exam: Pain on palpation at the keratotic lesion noted. Range of motion within normal limits bilateral. Muscle strength 5/5 in all groups bilateral.  Hammertoe contracture of the left second toe with overlap onto the hallux.  Assessment: 1. Onychodystrophic nails 1-5 bilateral with hyperkeratosis of nails.  2. Onychomycosis of nail due to dermatophyte  bilateral 3. Pre-ulcerative callus lesions noted to the bilateral feet x 4   Plan of Care:  1. Patient evaluated. 2. Excisional debridement of keratoic lesion using a chisel blade was performed without incident.  3. Dressed with light dressing. 4. Mechanical debridement of nails 1-5 bilaterally performed using a nail nipper. Filed with dremel without incident.  5.  Continue diabetic shoes and insoles 6.  Patient is to return to the clinic in 3 months.   Felecia Shelling, DPM [facility]  Dr. Felecia Shelling, DPM    [address]                                        [place], [place] [address]                Office [phone]  Fax [phone]

## 2020-08-03 ENCOUNTER — Encounter: Payer: Self-pay | Admitting: Podiatry

## 2020-08-03 ENCOUNTER — Other Ambulatory Visit: Payer: Self-pay

## 2020-08-03 ENCOUNTER — Ambulatory Visit (INDEPENDENT_AMBULATORY_CARE_PROVIDER_SITE_OTHER): Payer: Medicare Other | Admitting: Podiatry

## 2020-08-03 DIAGNOSIS — E0842 Diabetes mellitus due to underlying condition with diabetic polyneuropathy: Secondary | ICD-10-CM

## 2020-08-03 DIAGNOSIS — L989 Disorder of the skin and subcutaneous tissue, unspecified: Secondary | ICD-10-CM | POA: Diagnosis not present

## 2020-08-03 DIAGNOSIS — M79676 Pain in unspecified toe(s): Secondary | ICD-10-CM

## 2020-08-03 DIAGNOSIS — B351 Tinea unguium: Secondary | ICD-10-CM | POA: Diagnosis not present

## 2020-08-03 NOTE — Progress Notes (Signed)
    Subjective: Patient is a 85 y.o. male PMHx DM type II presenting to the office today for follow up evaluation of callus lesions of the bilateral feet. Walking increases the pain. He has not had any recent treatment for the symptoms.   Patient also complains of elongated, thickened nails that cause pain while ambulating in shoes. He is unable to trim his own nails. Patient presents today for further treatment and evaluation.  Past Medical History:  Diagnosis Date   Anemia    B12 deficiency    Bradycardia    Chronic kidney disease    chronic renal insufficency   Diabetes mellitus without complication (HCC)    Diverticulosis 01/25/2016   GERD (gastroesophageal reflux disease)    Hyperkalemia    Hyperlipidemia    Hypertension    Impotence    Pneumonia    PVC's (premature ventricular contractions)    Sleep apnea    no CPAP in 15 years   Wears dentures    partial lower    Objective:  Physical Exam General: Alert and oriented x3 in no acute distress  Dermatology: Hyperkeratotic lesions present on the bilateral feet. Pain on palpation with a central nucleated core noted. Skin is warm, dry and supple bilateral lower extremities. Negative for open lesions or macerations. Nails are tender, long, thickened and dystrophic with subungual debris, consistent with onychomycosis, 1-5 bilateral. No signs of infection noted.  Vascular: Palpable pedal pulses bilaterally. No edema or erythema noted. Capillary refill within normal limits.  Neurological: Epicritic and protective threshold diminished bilaterally.   Musculoskeletal Exam: Pain on palpation at the keratotic lesion noted. Range of motion within normal limits bilateral. Muscle strength 5/5 in all groups bilateral.  Hammertoe contracture of the left second toe with overlap onto the hallux.  Assessment: 1. Onychodystrophic nails 1-5 bilateral with hyperkeratosis of nails.  2. Onychomycosis of nail due to dermatophyte bilateral 3.  Pre-ulcerative callus lesions noted to the bilateral feet x 4   Plan of Care:  1. Patient evaluated. 2. Excisional debridement of keratoic lesion using a chisel blade was performed without incident.  3. Dressed with light dressing. 4. Mechanical debridement of nails 1-5 bilaterally performed using a nail nipper. Filed with dremel without incident.  5.  Continue diabetic shoes and insoles 6.  Patient is to return to the clinic in 3 months.   Felecia Shelling, DPM [facility]  Dr. Felecia Shelling, DPM    [address] [place], [place] [address]                Office [phone]  Fax [phone]

## 2020-11-05 ENCOUNTER — Ambulatory Visit (INDEPENDENT_AMBULATORY_CARE_PROVIDER_SITE_OTHER): Payer: Medicare Other | Admitting: Podiatry

## 2020-11-05 ENCOUNTER — Other Ambulatory Visit: Payer: Self-pay

## 2020-11-05 DIAGNOSIS — L989 Disorder of the skin and subcutaneous tissue, unspecified: Secondary | ICD-10-CM | POA: Diagnosis not present

## 2020-11-05 DIAGNOSIS — B351 Tinea unguium: Secondary | ICD-10-CM | POA: Diagnosis not present

## 2020-11-05 DIAGNOSIS — M79676 Pain in unspecified toe(s): Secondary | ICD-10-CM

## 2020-11-05 DIAGNOSIS — E0842 Diabetes mellitus due to underlying condition with diabetic polyneuropathy: Secondary | ICD-10-CM | POA: Diagnosis not present

## 2020-11-05 NOTE — Progress Notes (Signed)
    Subjective: Patient is a 85 y.o. male PMHx DM type II presenting to the office today for follow up evaluation of callus lesions of the bilateral feet. Walking increases the pain. He has not had any recent treatment for the symptoms.   Patient also complains of elongated, thickened nails that cause pain while ambulating in shoes. He is unable to trim his own nails. Patient presents today for further treatment and evaluation.  Past Medical History:  Diagnosis Date   Anemia    B12 deficiency    Bradycardia    Chronic kidney disease    chronic renal insufficency   Diabetes mellitus without complication (HCC)    Diverticulosis 01/25/2016   GERD (gastroesophageal reflux disease)    Hyperkalemia    Hyperlipidemia    Hypertension    Impotence    Pneumonia    PVC's (premature ventricular contractions)    Sleep apnea    no CPAP in 15 years   Wears dentures    partial lower    Objective:  Physical Exam General: Alert and oriented x3 in no acute distress  Dermatology: Hyperkeratotic lesions present on the bilateral feet. Pain on palpation with a central nucleated core noted. Skin is warm, dry and supple bilateral lower extremities. Negative for open lesions or macerations. Nails are tender, long, thickened and dystrophic with subungual debris, consistent with onychomycosis, 1-5 bilateral. No signs of infection noted.  Vascular: Palpable pedal pulses bilaterally. No edema or erythema noted. Capillary refill within normal limits.  Neurological: Epicritic and protective threshold diminished bilaterally.   Musculoskeletal Exam: Pain on palpation at the keratotic lesion noted. Range of motion within normal limits bilateral. Muscle strength 5/5 in all groups bilateral.  Hammertoe contracture of the left second toe with overlap onto the hallux.  Assessment: 1. Onychodystrophic nails 1-5 bilateral with hyperkeratosis of nails.  2. Onychomycosis of nail due to dermatophyte bilateral 3.  Pre-ulcerative callus lesions noted to the bilateral feet x 4   Plan of Care:  1. Patient evaluated. 2. Excisional debridement of keratoic lesion using a chisel blade was performed without incident.  3. Dressed with light dressing. 4. Mechanical debridement of nails 1-5 bilaterally performed using a nail nipper. Filed with dremel without incident.  5.  Continue diabetic shoes and insoles 6.  Patient is to return to the clinic in 3 months.   Felecia Shelling, DPM [facility]  Dr. Felecia Shelling, DPM    [address] [place], [place] [address]                Office [phone]  Fax [phone]

## 2020-11-30 ENCOUNTER — Encounter: Payer: Self-pay | Admitting: Ophthalmology

## 2020-12-02 NOTE — Discharge Instructions (Signed)

## 2020-12-10 ENCOUNTER — Ambulatory Visit: Payer: Medicare Other | Admitting: Anesthesiology

## 2020-12-10 ENCOUNTER — Encounter: Payer: Self-pay | Admitting: Ophthalmology

## 2020-12-10 ENCOUNTER — Other Ambulatory Visit: Payer: Self-pay

## 2020-12-10 ENCOUNTER — Encounter
Admission: RE | Disposition: A | Discharge status: Home / Self Care | Payer: Self-pay | Source: Home / Self Care | Attending: Ophthalmology

## 2020-12-10 ENCOUNTER — Ambulatory Visit
Admission: RE | Admit: 2020-12-10 | Discharge: 2020-12-10 | Disposition: A | Discharge status: Home / Self Care | Payer: Medicare Other | Attending: Ophthalmology | Admitting: Ophthalmology

## 2020-12-10 DIAGNOSIS — H02403 Unspecified ptosis of bilateral eyelids: Secondary | ICD-10-CM | POA: Insufficient documentation

## 2020-12-10 DIAGNOSIS — Z882 Allergy status to sulfonamides status: Secondary | ICD-10-CM | POA: Insufficient documentation

## 2020-12-10 DIAGNOSIS — G4730 Sleep apnea, unspecified: Secondary | ICD-10-CM | POA: Insufficient documentation

## 2020-12-10 DIAGNOSIS — E1122 Type 2 diabetes mellitus with diabetic chronic kidney disease: Secondary | ICD-10-CM | POA: Insufficient documentation

## 2020-12-10 DIAGNOSIS — G245 Blepharospasm: Secondary | ICD-10-CM | POA: Diagnosis not present

## 2020-12-10 DIAGNOSIS — K219 Gastro-esophageal reflux disease without esophagitis: Secondary | ICD-10-CM | POA: Insufficient documentation

## 2020-12-10 DIAGNOSIS — H02834 Dermatochalasis of left upper eyelid: Secondary | ICD-10-CM | POA: Insufficient documentation

## 2020-12-10 DIAGNOSIS — I129 Hypertensive chronic kidney disease with stage 1 through stage 4 chronic kidney disease, or unspecified chronic kidney disease: Secondary | ICD-10-CM | POA: Insufficient documentation

## 2020-12-10 DIAGNOSIS — H02831 Dermatochalasis of right upper eyelid: Secondary | ICD-10-CM | POA: Insufficient documentation

## 2020-12-10 DIAGNOSIS — N189 Chronic kidney disease, unspecified: Secondary | ICD-10-CM | POA: Diagnosis not present

## 2020-12-10 DIAGNOSIS — Z7984 Long term (current) use of oral hypoglycemic drugs: Secondary | ICD-10-CM | POA: Diagnosis not present

## 2020-12-10 DIAGNOSIS — Z79899 Other long term (current) drug therapy: Secondary | ICD-10-CM | POA: Insufficient documentation

## 2020-12-10 DIAGNOSIS — Z7982 Long term (current) use of aspirin: Secondary | ICD-10-CM | POA: Diagnosis not present

## 2020-12-10 DIAGNOSIS — E785 Hyperlipidemia, unspecified: Secondary | ICD-10-CM | POA: Diagnosis not present

## 2020-12-10 HISTORY — PX: BROW LIFT: SHX178

## 2020-12-10 LAB — GLUCOSE, CAPILLARY: Glucose-Capillary: 182 mg/dL — ABNORMAL HIGH (ref 70–99)

## 2020-12-10 SURGERY — BLEPHAROPLASTY
Anesthesia: General | Site: Eye | Laterality: Bilateral

## 2020-12-10 MED ORDER — ALFENTANIL 500 MCG/ML IJ INJ
INJECTION | INTRAVENOUS | Status: DC | PRN
  Administered 2020-12-10 (×2): 250 ug via INTRAVENOUS
  Administered 2020-12-10: 500 ug via INTRAVENOUS

## 2020-12-10 MED ORDER — ERYTHROMYCIN 5 MG/GM OP OINT: TOPICAL_OINTMENT | OPHTHALMIC | 2 refills | Status: AC

## 2020-12-10 MED ORDER — TETRACAINE HCL 0.5 % OP SOLN
OPHTHALMIC | Status: DC | PRN
  Administered 2020-12-10: 2 [drp] via OPHTHALMIC

## 2020-12-10 MED ORDER — ACETAMINOPHEN 160 MG/5ML PO SOLN: 975.0000 mg | Freq: Once | ORAL | Status: DC | PRN

## 2020-12-10 MED ORDER — ERYTHROMYCIN 5 MG/GM OP OINT
TOPICAL_OINTMENT | OPHTHALMIC | Status: DC | PRN
  Administered 2020-12-10: 1 via OPHTHALMIC

## 2020-12-10 MED ORDER — ACETAMINOPHEN 500 MG PO TABS: 1000.0000 mg | ORAL_TABLET | Freq: Once | ORAL | Status: DC | PRN

## 2020-12-10 MED ORDER — ONDANSETRON HCL 4 MG/2ML IJ SOLN
4.0000 mg | Freq: Once | INTRAMUSCULAR | Status: DC | PRN
Start: 2020-12-10 — End: 2020-12-10

## 2020-12-10 MED ORDER — TRAMADOL HCL 50 MG PO TABS: ORAL_TABLET | ORAL | 0 refills | Status: AC

## 2020-12-10 MED ORDER — LIDOCAINE-EPINEPHRINE 2 %-1:100000 IJ SOLN
INTRAMUSCULAR | Status: DC | PRN
  Administered 2020-12-10: 1.5 mL via OPHTHALMIC
  Administered 2020-12-10: .25 mL via OPHTHALMIC

## 2020-12-10 MED ORDER — LACTATED RINGERS IV SOLN: INTRAVENOUS | Status: DC

## 2020-12-10 MED ORDER — MIDAZOLAM HCL 2 MG/2ML IJ SOLN
INTRAMUSCULAR | Status: DC | PRN
  Administered 2020-12-10 (×2): 1 mg via INTRAVENOUS

## 2020-12-10 MED ORDER — BSS IO SOLN
INTRAOCULAR | Status: DC | PRN
  Administered 2020-12-10: 15 mL

## 2020-12-10 SURGICAL SUPPLY — 21 items
APPLICATOR COTTON TIP WD 3 STR (MISCELLANEOUS) ×2 IMPLANT
BLADE SURG 15 STRL LF DISP TIS (BLADE) ×1 IMPLANT
BLADE SURG 15 STRL SS (BLADE) ×2
CORD BIP STRL DISP 12FT (MISCELLANEOUS) ×2 IMPLANT
GAUZE SPONGE 4X4 12PLY STRL (GAUZE/BANDAGES/DRESSINGS) ×2 IMPLANT
GLOVE BIO SURGEON STRL SZ7 (GLOVE) ×4 IMPLANT
GOWN STRL REUS W/ TWL LRG LVL3 (GOWN DISPOSABLE) ×1 IMPLANT
GOWN STRL REUS W/TWL LRG LVL3 (GOWN DISPOSABLE) ×2
MARKER SKIN XFINE TIP W/RULER (MISCELLANEOUS) ×2 IMPLANT
NEEDLE FILTER BLUNT 18X 1/2SAF (NEEDLE) ×1
NEEDLE FILTER BLUNT 18X1 1/2 (NEEDLE) ×1 IMPLANT
NEEDLE HYPO 30X.5 LL (NEEDLE) ×4 IMPLANT
PACK ENT CUSTOM (PACKS) ×2 IMPLANT
SOL PREP PVP 2OZ (MISCELLANEOUS) ×2
SOLUTION PREP PVP 2OZ (MISCELLANEOUS) ×1 IMPLANT
SPONGE GAUZE 2X2 8PLY STRL LF (GAUZE/BANDAGES/DRESSINGS) ×20 IMPLANT
SUT GUT PLAIN 6-0 1X18 ABS (SUTURE) ×4 IMPLANT
SUT PROLENE 6 0 P 1 18 (SUTURE) ×4 IMPLANT
SYR 10ML LL (SYRINGE) ×2 IMPLANT
SYR 3ML LL SCALE MARK (SYRINGE) ×2 IMPLANT
WATER STERILE IRR 250ML POUR (IV SOLUTION) ×2 IMPLANT

## 2020-12-10 NOTE — H&P (Signed)
[facility]: [facility]  Primary Care Physician:  Leim Fabry, MD Ophthalmologist: Dr. Hubbard Robinson. Ether Griffins, M.D.  Pre-Procedure History & Physical: HPI:  Jermaine White is a 85 y.o. male here for periocular surgery.   Past Medical History:  Diagnosis Date   Anemia    B12 deficiency    Bradycardia    Chronic kidney disease    chronic renal insufficency   Diabetes mellitus without complication (HCC)    Diverticulosis 01/25/2016   GERD (gastroesophageal reflux disease)    Hyperkalemia    Hyperlipidemia    Hypertension    Impotence    Pneumonia    PVC's (premature ventricular contractions)    Sleep apnea    no CPAP in 15 years   Wears dentures    partial lower    Past Surgical History:  Procedure Laterality Date   BACK SURGERY  10/18/2016   CATARACT EXTRACTION W/PHACO Right 02/19/2020   Procedure: CATARACT EXTRACTION PHACO AND INTRAOCULAR LENS PLACEMENT (IOC) RIGHT DIABETIC PANOPTIX LENS;  Surgeon: Nevada Crane, MD;  Location: [facility];  Service: Ophthalmology;  Laterality: Right;  5.89 0:41.5   CATARACT EXTRACTION W/PHACO Left 03/01/2020   Procedure: CATARACT EXTRACTION PHACO AND INTRAOCULAR LENS PLACEMENT (IOC) LEFT DIABETIC PANOPTIX LENS;  Surgeon: Nevada Crane, MD;  Location: [facility];  Service: Ophthalmology;  Laterality: Left;  8.61 0:51.2   COLONOSCOPY     COLONOSCOPY WITH PROPOFOL N/A 01/25/2016   Procedure: COLONOSCOPY WITH PROPOFOL;  Surgeon: Christena Deem, MD;  Location: [facility] ENDOSCOPY;  Service: Endoscopy;  Laterality: N/A;   HERNIA REPAIR     x3   KNEE ARTHROSCOPY Left 03/26/2017   Procedure: ARTHROSCOPY KNEE, PARTIAL MEDIAL MENISECTOMY, CHONDROPLASTY;  Surgeon: Donato Heinz, MD;  Location: [facility] ORS;  Service: Orthopedics;  Laterality: Left;   LUMBAR LAMINECTOMY/DECOMPRESSION MICRODISCECTOMY Left 10/18/2016   Procedure: LAMINECTOMY AND FORAMINOTOMY LEFT LUMBAR THREE- LUMBAR FOUR, LUMBAR FOUR- LUMBAR FIVE;   Surgeon: Tressie Stalker, MD;  Location: [facility] OR;  Service: Neurosurgery;  Laterality: Left;   MOHS SURGERY Right 01/2020   Forearm, just below elbow    Prior to Admission medications   Medication Sig Start Date End Date Taking? Authorizing Provider  aspirin EC 81 MG tablet Take 81 mg by mouth at bedtime.   Yes [provider]  Black Elderberry 50 MG/5ML SYRP Take by mouth daily.   Yes [provider]  fluticasone (FLONASE) 50 MCG/ACT nasal spray  11/20/19  Yes [provider]  glipiZIDE (GLUCOTROL XL) 5 MG 24 hr tablet Take 10 mg by mouth 2 (two) times daily. 03/16/16 12/10/20 Yes [provider]  Glucosamine-Chondroitin (COSAMIN DS PO) Take 1 tablet by mouth 2 (two) times daily.   Yes [provider]  hydrochlorothiazide (HYDRODIURIL) 12.5 MG tablet  06/01/18  Yes [provider]  lisinopril (ZESTRIL) 5 MG tablet  06/21/18  Yes [provider]  lovastatin (MEVACOR) 40 MG tablet Take 40 mg by mouth at bedtime.  03/05/16  Yes [provider]  metFORMIN (GLUCOPHAGE-XR) 500 MG 24 hr tablet Take 500 mg by mouth 2 (two) times daily.    Yes [provider]  Multiple Vitamins-Minerals (ZINC PO) Take by mouth daily.   Yes [provider]  Probiotic Product (PROBIOTIC PO) Take by mouth.   Yes [provider]  vitamin B-12 (CYANOCOBALAMIN) 1000 MCG tablet Take 1,000 mcg by mouth daily.   Yes [provider]  ACCU-CHEK COMPACT PLUS test strip CHECK BLOOD SUGAR TWICE  A DAY. (DX E11.29) 04/14/15   [provider]  calcium carbonate (TUMS - DOSED IN MG ELEMENTAL CALCIUM) 500 MG chewable tablet Chew 1-2 tablets by mouth 2 (two) times daily as needed for indigestion or heartburn.  Patient not taking: Reported on 02/09/2020    [provider]  gentamicin cream (GARAMYCIN) 0.1 % Apply 1 application topically 2 (two) times daily. 04/16/18   Felecia Shelling, DPM  glucose blood (ACCU-CHEK COMPACT  PLUS) test strip CHECK BLOOD SUGAR TWICE A DAY. (DX E11.29) 02/23/16   [provider]    Allergies as of 08/31/2020 - Review Complete 04/30/2020  Allergen Reaction Noted   Sulfa antibiotics Other (See Comments) 05/05/2015    Family History  Problem Relation Age of Onset   Diabetes Mother    Heart disease Father     Social History   Socioeconomic History   Marital status: Married    Spouse name: Not on file   Number of children: Not on file   Years of education: Not on file   Highest education level: Not on file  Occupational History   Not on file  Tobacco Use   Smoking status: Never   Smokeless tobacco: Never  Vaping Use   Vaping Use: Never used  Substance and Sexual Activity   Alcohol use: No    Alcohol/week: 0.0 standard drinks   Drug use: No   Sexual activity: Not on file  Other Topics Concern   Not on file  Social History Narrative   Not on file   Social Determinants of Health   Financial Resource Strain: Not on file  Food Insecurity: Not on file  Transportation Needs: Not on file  Physical Activity: Not on file  Stress: Not on file  Social Connections: Not on file  Intimate Partner Violence: Not on file    Review of Systems: See HPI, otherwise negative ROS  Physical Exam: BP (!) 163/82   Pulse 78   Temp 97.7 F (36.5 C) (Temporal)   Resp 16   Ht 5\' 9"  (1.753 m)   Wt 87.1 kg   SpO2 98%   BMI 28.35 kg/m  General:   Alert and cooperative in NAD Head:  Normocephalic and atraumatic. Respiratory:  Normal work of breathing.  Impression/Plan: Jermaine White is here for periocular surgery.  Risks, benefits, limitations, and alternatives regarding surgery have been reviewed with the patient.  Questions have been answered.  All parties agreeable.   Randa Lynn, MD  12/10/2020, 7:37 AM

## 2020-12-10 NOTE — Transfer of Care (Signed)
Immediate Anesthesia Transfer of Care Note  Patient: Jermaine White  Procedure(s) Performed: BLEPHAROPLASTY UPPER EYELID; W/ EXCESS SKIN + BLEPHAROPTOSIS REPAIR; RESECT EX BILATERAL (Bilateral: Eye)  Patient Location: PACU  Anesthesia Type: General  Level of Consciousness: awake, alert  and patient cooperative  Airway and Oxygen Therapy: Patient Spontanous Breathing and Patient connected to supplemental oxygen  Post-op Assessment: Post-op Vital signs reviewed, Patient's Cardiovascular Status Stable, Respiratory Function Stable, Patent Airway and No signs of Nausea or vomiting  Post-op Vital Signs: Reviewed and stable  Complications: No notable events documented.

## 2020-12-10 NOTE — Anesthesia Postprocedure Evaluation (Signed)
Anesthesia Post Note  Patient: Jermaine White  Procedure(s) Performed: BLEPHAROPLASTY UPPER EYELID; W/ EXCESS SKIN + BLEPHAROPTOSIS REPAIR; RESECT EX BILATERAL (Bilateral: Eye)     Patient location during evaluation: PACU Anesthesia Type: General Level of consciousness: awake and alert Pain management: pain level controlled Vital Signs Assessment: post-procedure vital signs reviewed and stable Respiratory status: spontaneous breathing Cardiovascular status: stable Anesthetic complications: no   No notable events documented.  Marvis Repress

## 2020-12-10 NOTE — Op Note (Signed)
Preoperative Diagnosis:  1. Visually significant blepharoptosis bilateral  Upper Eyelid(s) 2. Visually significant dermatochalasis bilateral  Upper Eyelid(s) 3.  Benign Essential blepharospasm  Postoperative Diagnosis:  Same.  Procedure(s) Performed:   1. Blepharoptosis repair with levator aponeurosis advancement both  Upper Eyelid(s) 2. Upper eyelid blepharoplasty with excess skin excision and partial myectomy  bilateral  Upper Eyelid(s)  Surgeon: Jermaine White. Jermaine White, M.D.  Assistants: none  Anesthesia: MAC  Specimens: None.  Estimated Blood Loss: Minimal.  Complications: None.  Operative Findings: None Dictated  Procedure:   Allergies were reviewed and the patient Sulfa antibiotics.   After the risks, benefits, complications and alternatives were discussed with the patient, appropriate informed consent was obtained.  While seated in an upright position and looking in primary gaze, the mid pupillary line was marked on the upper eyelid margins bilaterally. The patient was then brought to the operating suite and reclined supine.  Timeout was conducted and the patient was sedated.  Local anesthetic consisting of a 50-50 mixture of 2% lidocaine with epinephrine and 0.75% bupivacaine with added Hylenex was injected subcutaneously to both  upper eyelid(s). After adequate local was instilled, the patient was prepped and draped in the usual sterile fashion for eyelid surgery.   Attention was turned to the upper eyelids. A 37m upper eyelid crease incision line was marked with calipers on both  upper eyelid(s).  A pinch test was used to estimate the amount of excess skin to remove and this was marked in standard blepharoplasty style fashion. Attention was turned to the  right  upper eyelid. A #15 blade was used to open the premarked incision line. A Skin and muscle flap was excised and hemostasis was obtained with bipolar cautery. Additional orbicularis was removed at the margins of the skin to  help further weaken the muscle.  Westcott scissors were then used to transect through orbicularis down to the tarsal plate. Epitarsus was dissected to create a smooth surface to suture to. Dissection was then carried superiorly in the plane between orbicularis and orbital septum. Once the preaponeurotic fat pocket was identified, the orbital septum was opened. This revealed the levator and its aponeurosis.    Attention was then turned to the opposite eyelid where the same procedure was performed in the same manner. Hemostasis was obtained with bipolar cautery throughout.   3 interrupted 6-0 Prolene sutures were then passed partial thickness through the tarsal plates of both  upper eyelid(s). These sutures were placed in line with the mid pupillary, medial limbal, and lateral limbal lines. The sutures were fixed to the levator aponeurosis and adjusted until a nice lid height and contour were achieved. Once nice symmetry was achieved, the skin incisions were closed with a running 6-0 plain gut suture. The patient tolerated the procedure well.  Erythromycin ophthalmic ophthalmic ointment was applied to the incision site(s) followed by ice packs. The patient was taken to the recovery area where he recovered without difficulty.  Post-Op Plan/Instructions:   The patient was instructed to use ice packs frequently for the next 48 hours. He was instructed to use Erythromycin ophthalmic ophthalmic ointment on his incisions 4 times a day for the next 12 to 14 days. Hewas given a prescription for tramadol (or similar) for pain control should Tylenol not be effective. [facility] or sooner as needed for problems.  Jermaine White M. FVickki White M.D. Ophthalmology

## 2020-12-10 NOTE — Interval H&P Note (Signed)
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  The various methods of treatment have been discussed with the patient and family. After consideration of risks, benefits and other options for treatment, the patient has consented to  Procedure(s) with comments: BLEPHAROPLASTY UPPER EYELID; W/ EXCESS SKIN + BLEPHAROPTOSIS REPAIR; RESECT EX BILATERAL (Bilateral) - Diabetic as a surgical intervention.  The patient's history has been reviewed, patient examined, no change in status, stable for surgery.  I have reviewed the patient's chart and labs.  Questions were answered to the patient's satisfaction.     Ether Griffins, Velena Keegan M

## 2020-12-10 NOTE — Anesthesia Preprocedure Evaluation (Signed)
Anesthesia Evaluation  Patient identified by MRN, date of birth, ID band Patient awake    Reviewed: Allergy & Precautions, H&P , NPO status , Patient's Chart, lab work & pertinent test results  Airway Mallampati: II  TM Distance: >3 FB Neck ROM: full    Dental no notable dental hx.    Pulmonary sleep apnea ,    Pulmonary exam normal        Cardiovascular hypertension, On Medications Normal cardiovascular exam Rhythm:regular Rate:Normal     Neuro/Psych negative neurological ROS  negative psych ROS   GI/Hepatic Neg liver ROS, Medicated,  Endo/Other  diabetes, Well Controlled, Type 2  Renal/GU   negative genitourinary   Musculoskeletal   Abdominal   Peds  Hematology   Anesthesia Other Findings   Reproductive/Obstetrics                             Anesthesia Physical Anesthesia Plan  ASA: 2  Anesthesia Plan: General   Post-op Pain Management:    Induction:   PONV Risk Score and Plan: 2 and Ondansetron, Propofol infusion and Treatment may vary due to age or medical condition  Airway Management Planned:   Additional Equipment:   Intra-op Plan:   Post-operative Plan:   Informed Consent: I have reviewed the patients History and Physical, chart, labs and discussed the procedure including the risks, benefits and alternatives for the proposed anesthesia with the patient or authorized representative who has indicated his/her understanding and acceptance.       Plan Discussed with:   Anesthesia Plan Comments:         Anesthesia Quick Evaluation

## 2020-12-10 NOTE — Anesthesia Procedure Notes (Signed)
Date/Time: 12/10/2020 8:10 AM Performed by: Maree Krabbe, CRNA Pre-anesthesia Checklist: Patient identified, Emergency Drugs available, Suction available, Timeout performed and Patient being monitored Patient Re-evaluated:Patient Re-evaluated prior to induction Oxygen Delivery Method: Nasal cannula Placement Confirmation: positive ETCO2

## 2020-12-13 ENCOUNTER — Encounter: Payer: Self-pay | Admitting: Ophthalmology

## 2021-02-04 ENCOUNTER — Ambulatory Visit (INDEPENDENT_AMBULATORY_CARE_PROVIDER_SITE_OTHER): Payer: Medicare Other | Admitting: Podiatry

## 2021-02-04 ENCOUNTER — Other Ambulatory Visit: Payer: Self-pay

## 2021-02-04 ENCOUNTER — Encounter: Payer: Self-pay | Admitting: Podiatry

## 2021-02-04 DIAGNOSIS — E0842 Diabetes mellitus due to underlying condition with diabetic polyneuropathy: Secondary | ICD-10-CM | POA: Diagnosis not present

## 2021-02-04 DIAGNOSIS — B351 Tinea unguium: Secondary | ICD-10-CM

## 2021-02-04 DIAGNOSIS — L989 Disorder of the skin and subcutaneous tissue, unspecified: Secondary | ICD-10-CM | POA: Diagnosis not present

## 2021-02-04 DIAGNOSIS — M79676 Pain in unspecified toe(s): Secondary | ICD-10-CM | POA: Diagnosis not present

## 2021-02-04 NOTE — Progress Notes (Signed)
° ° °  Subjective: Patient is a 85 y.o. male PMHx DM type II presenting to the office today for routine foot care of callus lesions of the bilateral feet and elongated toenails to the bilateral feet. Walking increases the pain.  He is unable to trim his own nails. Patient presents today for further treatment and evaluation.  Past Medical History:  Diagnosis Date   Anemia    B12 deficiency    Bradycardia    Chronic kidney disease    chronic renal insufficency   Diabetes mellitus without complication (HCC)    Diverticulosis 01/25/2016   GERD (gastroesophageal reflux disease)    Hyperkalemia    Hyperlipidemia    Hypertension    Impotence    Pneumonia    PVC's (premature ventricular contractions)    Sleep apnea    no CPAP in 15 years   Wears dentures    partial lower    Objective:  Physical Exam General: Alert and oriented x3 in no acute distress  Dermatology: Hyperkeratotic lesions present on the bilateral feet. Pain on palpation with a central nucleated core noted. Skin is warm, dry and supple bilateral lower extremities. Negative for open lesions or macerations. Nails are tender, long, thickened and dystrophic with subungual debris, consistent with onychomycosis, 1-5 bilateral. No signs of infection noted.  Vascular: Palpable pedal pulses bilaterally. No edema or erythema noted. Capillary refill within normal limits.  Neurological: Epicritic and protective threshold diminished bilaterally.   Musculoskeletal Exam: Pain on palpation at the keratotic lesion noted. Range of motion within normal limits bilateral. Muscle strength 5/5 in all groups bilateral.  Hammertoe contracture of the left second toe with overlap onto the hallux.  Hallux valgus deformity also noted  Assessment: 1.  Pain due to onychomycosis of toenails both  2.  Pre-ulcerative callus lesions noted to the bilateral feet x 4 3.  Hallux valgus and hammertoe deformity bilateral  Plan of Care:  1. Patient  evaluated. 2. Excisional debridement of keratoic lesion using a chisel blade was performed without incident.  3. Dressed with light dressing. 4. Mechanical debridement of nails 1-5 bilaterally performed using a nail nipper. Filed with dremel without incident.  5.  Continue diabetic shoes and insoles 6.  Patient is to return to the clinic in 3 months.   Felecia Shelling, DPM [facility]  Dr. Felecia Shelling, DPM    [address] [place], [place] [address]                Office [phone]  Fax [phone]

## 2021-04-12 ENCOUNTER — Other Ambulatory Visit: Payer: Self-pay | Admitting: Physician Assistant

## 2021-04-12 DIAGNOSIS — M5416 Radiculopathy, lumbar region: Secondary | ICD-10-CM

## 2021-04-20 ENCOUNTER — Other Ambulatory Visit: Payer: Self-pay

## 2021-04-20 ENCOUNTER — Ambulatory Visit
Admission: RE | Admit: 2021-04-20 | Discharge: 2021-04-20 | Disposition: A | Discharge status: Home / Self Care | Payer: Medicare Other | Source: Ambulatory Visit | Attending: Physician Assistant | Admitting: Physician Assistant

## 2021-04-20 DIAGNOSIS — M5416 Radiculopathy, lumbar region: Secondary | ICD-10-CM | POA: Insufficient documentation

## 2021-05-06 ENCOUNTER — Other Ambulatory Visit: Payer: Self-pay

## 2021-05-06 ENCOUNTER — Encounter: Payer: Self-pay | Admitting: Podiatry

## 2021-05-06 ENCOUNTER — Ambulatory Visit (INDEPENDENT_AMBULATORY_CARE_PROVIDER_SITE_OTHER): Payer: Medicare Other | Admitting: Podiatry

## 2021-05-06 DIAGNOSIS — E0842 Diabetes mellitus due to underlying condition with diabetic polyneuropathy: Secondary | ICD-10-CM

## 2021-05-06 DIAGNOSIS — B351 Tinea unguium: Secondary | ICD-10-CM

## 2021-05-06 DIAGNOSIS — Q828 Other specified congenital malformations of skin: Secondary | ICD-10-CM

## 2021-05-06 DIAGNOSIS — M79676 Pain in unspecified toe(s): Secondary | ICD-10-CM

## 2021-05-06 DIAGNOSIS — L989 Disorder of the skin and subcutaneous tissue, unspecified: Secondary | ICD-10-CM

## 2021-05-06 NOTE — Progress Notes (Signed)
? ? ?  Subjective: °Patient is a 85 y.o. male PMHx DM type II presenting to the office today for routine foot care of callus lesions of the bilateral feet and elongated toenails to the bilateral feet. Walking increases the pain.  He is unable to trim his own nails. Patient presents today for further treatment and evaluation. ° °Past Medical History:  °Diagnosis Date  ° Anemia   ° B12 deficiency   ° Bradycardia   ° Chronic kidney disease   ° chronic renal insufficency  ° Diabetes mellitus without complication (HCC)   ° Diverticulosis 01/25/2016  ° GERD (gastroesophageal reflux disease)   ° Hyperkalemia   ° Hyperlipidemia   ° Hypertension   ° Impotence   ° Pneumonia   ° PVC's (premature ventricular contractions)   ° Sleep apnea   ° no CPAP in 15 years  ° Wears dentures   ° partial lower  ° ° °Objective:  °Physical Exam °General: Alert and oriented x3 in no acute distress ° °Dermatology: Hyperkeratotic lesions present on the bilateral feet. Pain on palpation with a central nucleated core noted. Skin is warm, dry and supple bilateral lower extremities. Negative for open lesions or macerations. Nails are tender, long, thickened and dystrophic with subungual debris, consistent with onychomycosis, 1-5 bilateral. No signs of infection noted. ° °Vascular: Palpable pedal pulses bilaterally. No edema or erythema noted. Capillary refill within normal limits. ° °Neurological: Epicritic and protective threshold diminished bilaterally.  ° °Musculoskeletal Exam: Pain on palpation at the keratotic lesion noted. Range of motion within normal limits bilateral. Muscle strength 5/5 in all groups bilateral.  Hammertoe contracture of the left second toe with overlap onto the hallux.  Hallux valgus deformity also noted ° °Assessment: °1.  Pain due to onychomycosis of toenails both  °2.  Pre-ulcerative callus lesions noted to the bilateral feet x 4 °3.  Hallux valgus and hammertoe deformity bilateral ° °Plan of Care:  °1. Patient  evaluated. °2. Excisional debridement of keratoic lesion using a chisel blade was performed without incident.  °3. Dressed with light dressing. °4. Mechanical debridement of nails 1-5 bilaterally performed using a nail nipper. Filed with dremel without incident.  °5.  Continue diabetic shoes and insoles °6.  Patient is to return to the clinic in 3 months.  ° °Jermaine White M. Jermaine White, DPM °[facility] ° °Dr. Aubryn Spinola M. Zaidan Keeble, DPM  °  °[address]                                      °[place], [place] [address]                °Office [phone]  °Fax [phone] ° ° °

## 2021-05-17 ENCOUNTER — Encounter (HOSPITAL_COMMUNITY): Payer: Self-pay | Admitting: Radiology

## 2021-05-25 ENCOUNTER — Other Ambulatory Visit: Payer: Self-pay | Admitting: Nephrology

## 2021-05-25 DIAGNOSIS — N281 Cyst of kidney, acquired: Secondary | ICD-10-CM

## 2021-06-08 ENCOUNTER — Ambulatory Visit
Admission: RE | Admit: 2021-06-08 | Discharge: 2021-06-08 | Disposition: A | Discharge status: Home / Self Care | Payer: Medicare Other | Source: Ambulatory Visit | Attending: Nephrology | Admitting: Nephrology

## 2021-06-08 DIAGNOSIS — N281 Cyst of kidney, acquired: Secondary | ICD-10-CM | POA: Insufficient documentation

## 2021-06-08 MED ORDER — GADOBUTROL 1 MMOL/ML IV SOLN
7.0000 mL | Freq: Once | INTRAVENOUS | Status: AC | PRN
  Administered 2021-06-08: 7 mL via INTRAVENOUS

## 2021-08-05 ENCOUNTER — Ambulatory Visit (INDEPENDENT_AMBULATORY_CARE_PROVIDER_SITE_OTHER): Payer: Medicare Other | Admitting: Podiatry

## 2021-08-05 DIAGNOSIS — M2011 Hallux valgus (acquired), right foot: Secondary | ICD-10-CM

## 2021-08-05 DIAGNOSIS — L989 Disorder of the skin and subcutaneous tissue, unspecified: Secondary | ICD-10-CM | POA: Diagnosis not present

## 2021-08-05 DIAGNOSIS — M79676 Pain in unspecified toe(s): Secondary | ICD-10-CM | POA: Diagnosis not present

## 2021-08-05 DIAGNOSIS — M2012 Hallux valgus (acquired), left foot: Secondary | ICD-10-CM

## 2021-08-05 DIAGNOSIS — B351 Tinea unguium: Secondary | ICD-10-CM

## 2021-08-05 DIAGNOSIS — E0842 Diabetes mellitus due to underlying condition with diabetic polyneuropathy: Secondary | ICD-10-CM

## 2021-08-05 NOTE — Progress Notes (Signed)
    Subjective: Patient is a 86 y.o. male PMHx DM type II presenting to the office today for routine foot care of callus lesions of the bilateral feet and elongated toenails to the bilateral feet. Walking increases the pain.  He is unable to trim his own nails. Patient presents today for further treatment and evaluation.  Past Medical History:  Diagnosis Date   Anemia    B12 deficiency    Bradycardia    Chronic kidney disease    chronic renal insufficency   Diabetes mellitus without complication (HCC)    Diverticulosis 01/25/2016   GERD (gastroesophageal reflux disease)    Hyperkalemia    Hyperlipidemia    Hypertension    Impotence    Pneumonia    PVC's (premature ventricular contractions)    Sleep apnea    no CPAP in 15 years   Wears dentures    partial lower    Objective:  Physical Exam General: Alert and oriented x3 in no acute distress  Dermatology: Hyperkeratotic lesions present on the bilateral feet. Pain on palpation with a central nucleated core noted. Skin is warm, dry and supple bilateral lower extremities. Negative for open lesions or macerations. Nails are tender, long, thickened and dystrophic with subungual debris, consistent with onychomycosis, 1-5 bilateral. No signs of infection noted.  Vascular: Palpable pedal pulses bilaterally. No edema or erythema noted. Capillary refill within normal limits.  Neurological: Epicritic and protective threshold diminished bilaterally.   Musculoskeletal Exam: Pain on palpation at the keratotic lesion noted. Range of motion within normal limits bilateral. Muscle strength 5/5 in all groups bilateral.  Hammertoe contracture of the left second toe with overlap onto the hallux.  Hallux valgus deformity also noted  Assessment: 1.  Pain due to onychomycosis of toenails both  2.  Pre-ulcerative callus lesions noted to the bilateral feet x 4 3.  Hallux valgus and hammertoe deformity bilateral  Plan of Care:  1. Patient  evaluated. 2. Excisional debridement of keratoic lesion using a chisel blade was performed without incident as a courtesy for the patient.  3. Dressed with light dressing. 4. Mechanical debridement of nails 1-5 bilaterally performed using a nail nipper. Filed with dremel without incident.  5.  Continue diabetic shoes and insoles 6.  Patient is to return to the clinic in 3 months.   Felecia Shelling, DPM [facility]  Dr. Felecia Shelling, DPM    [address] [place], [place] [address]                Office [phone]  Fax [phone]

## 2021-11-04 ENCOUNTER — Ambulatory Visit (INDEPENDENT_AMBULATORY_CARE_PROVIDER_SITE_OTHER): Payer: Medicare Other | Admitting: Podiatry

## 2021-11-04 DIAGNOSIS — E119 Type 2 diabetes mellitus without complications: Secondary | ICD-10-CM

## 2021-11-04 DIAGNOSIS — B351 Tinea unguium: Secondary | ICD-10-CM | POA: Diagnosis not present

## 2021-11-04 DIAGNOSIS — M79676 Pain in unspecified toe(s): Secondary | ICD-10-CM | POA: Diagnosis not present

## 2021-11-04 NOTE — Progress Notes (Signed)
   Chief Complaint  Patient presents with   foot care     Patient is here for diabetic foot care.    Subjective: Patient is a 86 y.o. male PMHx DM type II presenting to the office today for routine foot care of callus lesions of the bilateral feet and elongated toenails to the bilateral feet. Walking increases the pain.  He is unable to trim his own nails. Patient presents today for further treatment and evaluation.  Past Medical History:  Diagnosis Date   Anemia    B12 deficiency    Bradycardia    Chronic kidney disease    chronic renal insufficency   Diabetes mellitus without complication (HCC)    Diverticulosis 01/25/2016   GERD (gastroesophageal reflux disease)    Hyperkalemia    Hyperlipidemia    Hypertension    Impotence    Pneumonia    PVC's (premature ventricular contractions)    Sleep apnea    no CPAP in 15 years   Wears dentures    partial lower    Objective:  Physical Exam General: Alert and oriented x3 in no acute distress  Dermatology: Hyperkeratotic lesions present on the bilateral feet. Pain on palpation with a central nucleated core noted. Skin is warm, dry and supple bilateral lower extremities. Negative for open lesions or macerations. Nails are tender, long, thickened and dystrophic with subungual debris, consistent with onychomycosis, 1-5 bilateral. No signs of infection noted.  Vascular: Palpable pedal pulses bilaterally. No edema or erythema noted. Capillary refill within normal limits.  Neurological: Epicritic and protective threshold diminished bilaterally.   Musculoskeletal Exam: Pain on palpation at the keratotic lesion noted. Range of motion within normal limits bilateral. Muscle strength 5/5 in all groups bilateral.  Hammertoe contracture of the left second toe with overlap onto the hallux.  Hallux valgus deformity also noted  Assessment: 1.  Pain due to onychomycosis of toenails both  2.  Pre-ulcerative callus lesions noted to the bilateral  feet x 4 3.  Hallux valgus and hammertoe deformity bilateral  Plan of Care:  1. Patient evaluated.  Comprehensive diabetic foot exam performed today 2. Excisional debridement of keratoic lesion using a chisel blade was performed without incident as a courtesy for the patient.  3. Dressed with light dressing. 4. Mechanical debridement of nails 1-5 bilaterally performed using a nail nipper. Filed with dremel without incident.  5.  Continue diabetic shoes and insoles 6.  Patient is to return to the clinic in 3 months.   Chrystle Murillo M. Estelle Skibicki, DPM [facility]  Dr. Yuan Gann M. Claudetta Sallie, DPM    [address]                                      , [place] [address]                Office [phone]  Fax [phone] 

## 2022-02-03 ENCOUNTER — Ambulatory Visit (INDEPENDENT_AMBULATORY_CARE_PROVIDER_SITE_OTHER): Payer: Medicare Other | Admitting: Podiatry

## 2022-02-03 DIAGNOSIS — B351 Tinea unguium: Secondary | ICD-10-CM

## 2022-02-03 DIAGNOSIS — M79676 Pain in unspecified toe(s): Secondary | ICD-10-CM | POA: Diagnosis not present

## 2022-02-03 NOTE — Progress Notes (Signed)
   Chief Complaint  Patient presents with   foot care     Patient is here for diabetic foot care.    Subjective: Patient is a 86 y.o. male PMHx DM type II presenting to the office today for routine foot care of callus lesions of the bilateral feet and elongated toenails to the bilateral feet. Walking increases the pain.  He is unable to trim his own nails. Patient presents today for further treatment and evaluation.  Past Medical History:  Diagnosis Date   Anemia    B12 deficiency    Bradycardia    Chronic kidney disease    chronic renal insufficency   Diabetes mellitus without complication (HCC)    Diverticulosis 01/25/2016   GERD (gastroesophageal reflux disease)    Hyperkalemia    Hyperlipidemia    Hypertension    Impotence    Pneumonia    PVC's (premature ventricular contractions)    Sleep apnea    no CPAP in 15 years   Wears dentures    partial lower    Objective:  Physical Exam General: Alert and oriented x3 in no acute distress  Dermatology: Hyperkeratotic lesions present on the bilateral feet. Pain on palpation with a central nucleated core noted. Skin is warm, dry and supple bilateral lower extremities. Negative for open lesions or macerations. Nails are tender, long, thickened and dystrophic with subungual debris, consistent with onychomycosis, 1-5 bilateral. No signs of infection noted.  Vascular: Palpable pedal pulses bilaterally. No edema or erythema noted. Capillary refill within normal limits.  Neurological: Epicritic and protective threshold diminished bilaterally.   Musculoskeletal Exam: Pain on palpation at the keratotic lesion noted. Range of motion within normal limits bilateral. Muscle strength 5/5 in all groups bilateral.  Hammertoe contracture of the left second toe with overlap onto the hallux.  Hallux valgus deformity also noted  Assessment: 1.  Pain due to onychomycosis of toenails both  2.  Pre-ulcerative callus lesions noted to the bilateral  feet x 4 3.  Hallux valgus and hammertoe deformity bilateral  Plan of Care:  1. Patient evaluated.  Comprehensive diabetic foot exam performed today 2. Excisional debridement of keratoic lesion using a chisel blade was performed without incident as a courtesy for the patient.  3. Dressed with light dressing. 4. Mechanical debridement of nails 1-5 bilaterally performed using a nail nipper. Filed with dremel without incident.  5.  Continue diabetic shoes and insoles 6.  Patient is to return to the clinic in 3 months.   Felecia Shelling, DPM [facility]  Dr. Felecia Shelling, DPM    [address] [place], [place] [address]                Office [phone]  Fax [phone]

## 2022-05-09 ENCOUNTER — Encounter: Payer: Self-pay | Admitting: Podiatry

## 2022-05-09 ENCOUNTER — Ambulatory Visit (INDEPENDENT_AMBULATORY_CARE_PROVIDER_SITE_OTHER): Payer: Medicare Other | Admitting: Podiatry

## 2022-05-09 VITALS — BP 141/70 | HR 73

## 2022-05-09 DIAGNOSIS — B351 Tinea unguium: Secondary | ICD-10-CM

## 2022-05-09 DIAGNOSIS — M79676 Pain in unspecified toe(s): Secondary | ICD-10-CM

## 2022-05-09 NOTE — Progress Notes (Signed)
   Chief Complaint  Patient presents with   Nail Problem    "Cut my toenails."    Subjective: Patient is a 87 y.o. male PMHx DM type II presenting to the office today for routine foot care of callus lesions of the bilateral feet and elongated toenails to the bilateral feet. Walking increases the pain.  He is unable to trim his own nails. Patient presents today for further treatment and evaluation.  Past Medical History:  Diagnosis Date   Anemia    B12 deficiency    Bradycardia    Chronic kidney disease    chronic renal insufficency   Diabetes mellitus without complication ([place])    Diverticulosis 01/25/2016   GERD (gastroesophageal reflux disease)    Hyperkalemia    Hyperlipidemia    Hypertension    Impotence    Pneumonia    PVC's (premature ventricular contractions)    Sleep apnea    no CPAP in 15 years   Wears dentures    partial lower    Objective:  Physical Exam General: Alert and oriented x3 in no acute distress  Dermatology: Hyperkeratotic lesions present on the bilateral feet. Pain on palpation with a central nucleated core noted. Skin is warm, dry and supple bilateral lower extremities. Negative for open lesions or macerations. Nails are tender, long, thickened and dystrophic with subungual debris, consistent with onychomycosis, 1-5 bilateral. No signs of infection noted.  Vascular: Palpable pedal pulses bilaterally. No edema or erythema noted. Capillary refill within normal limits.  Neurological: Epicritic and protective threshold diminished bilaterally.   Musculoskeletal Exam: Pain on palpation at the keratotic lesion noted. Range of motion within normal limits bilateral. Muscle strength 5/5 in all groups bilateral.  Hammertoe contracture of the left second toe with overlap onto the hallux.  Hallux valgus deformity also noted  Assessment: 1.  Pain due to onychomycosis of toenails both  2.  Pre-ulcerative callus lesions noted to the bilateral feet x 4 3.  Hallux  valgus and hammertoe deformity bilateral  Plan of Care:  1. Patient evaluated.  Comprehensive diabetic foot exam performed today 2. Excisional debridement of keratoic lesion using a chisel blade was performed without incident as a courtesy for the patient.  3. Dressed with light dressing. 4. Mechanical debridement of nails 1-5 bilaterally performed using a nail nipper. Filed with dremel without incident.  5.  Continue diabetic shoes and insoles 6.  Patient is to return to the clinic in 3 months.   Edrick Kins, DPM [facility]  Dr. Edrick Kins, DPM    [address] [place], [place] [address]                Office [phone]  Fax [phone]

## 2022-08-11 ENCOUNTER — Encounter: Payer: Self-pay | Admitting: Podiatry

## 2022-08-11 ENCOUNTER — Ambulatory Visit (INDEPENDENT_AMBULATORY_CARE_PROVIDER_SITE_OTHER): Payer: Medicare Other | Admitting: Podiatry

## 2022-08-11 VITALS — BP 147/70 | HR 66

## 2022-08-11 DIAGNOSIS — B351 Tinea unguium: Secondary | ICD-10-CM

## 2022-08-11 DIAGNOSIS — M79676 Pain in unspecified toe(s): Secondary | ICD-10-CM | POA: Diagnosis not present

## 2022-08-11 NOTE — Progress Notes (Signed)
   Chief Complaint  Patient presents with   Nail Problem    "Trimming toenails"    Subjective: Patient is a 87 y.o. male PMHx DM type II presenting to the office today for routine foot care of callus lesions of the bilateral feet and elongated toenails to the bilateral feet. Walking increases the pain.  He is unable to trim his own nails. Patient presents today for further treatment and evaluation.  Past Medical History:  Diagnosis Date   Anemia    B12 deficiency    Bradycardia    Chronic kidney disease    chronic renal insufficency   Diabetes mellitus without complication (HCC)    Diverticulosis 01/25/2016   GERD (gastroesophageal reflux disease)    Hyperkalemia    Hyperlipidemia    Hypertension    Impotence    Pneumonia    PVC's (premature ventricular contractions)    Sleep apnea    no CPAP in 15 years   Wears dentures    partial lower    Objective:  Physical Exam General: Alert and oriented x3 in no acute distress  Dermatology: Hyperkeratotic lesions present on the bilateral feet. Pain on palpation with a central nucleated core noted. Skin is warm, dry and supple bilateral lower extremities. Negative for open lesions or macerations. Nails are tender, long, thickened and dystrophic with subungual debris, consistent with onychomycosis, 1-5 bilateral. No signs of infection noted.  Vascular: Palpable pedal pulses bilaterally. No edema or erythema noted. Capillary refill within normal limits.  Neurological: Epicritic and protective threshold diminished bilaterally.   Musculoskeletal Exam: Pain on palpation at the keratotic lesion noted. Range of motion within normal limits bilateral. Muscle strength 5/5 in all groups bilateral.  Hammertoe contracture of the left second toe with overlap onto the hallux.  Hallux valgus deformity also noted  Assessment: 1.  Pain due to onychomycosis of toenails both  2.  Pre-ulcerative callus lesions noted to the bilateral feet x 4 3.   Hallux valgus and hammertoe deformity bilateral  Plan of Care:  1. Patient evaluated.  Comprehensive diabetic foot exam performed today 2. Excisional debridement of keratoic lesion using a chisel blade was performed without incident as a courtesy for the patient.  3. Dressed with light dressing. 4. Mechanical debridement of nails 1-5 bilaterally performed using a nail nipper. Filed with dremel without incident.  5.  Continue diabetic shoes and insoles 6.  Patient is to return to the clinic in 3 months.   Felecia Shelling, DPM [facility]  Dr. Felecia Shelling, DPM    [address] [place], [place] [address]                Office [phone]  Fax [phone]

## 2022-11-10 ENCOUNTER — Ambulatory Visit (INDEPENDENT_AMBULATORY_CARE_PROVIDER_SITE_OTHER): Payer: Medicare Other | Admitting: Podiatry

## 2022-11-10 ENCOUNTER — Encounter: Payer: Self-pay | Admitting: Podiatry

## 2022-11-10 VITALS — BP 135/78 | HR 70

## 2022-11-10 DIAGNOSIS — B351 Tinea unguium: Secondary | ICD-10-CM

## 2022-11-10 DIAGNOSIS — M79676 Pain in unspecified toe(s): Secondary | ICD-10-CM | POA: Diagnosis not present

## 2022-11-10 NOTE — Progress Notes (Signed)
   Chief Complaint  Patient presents with   Diabetes    "I'm supposed to get my toenails trimmed, I think."    Subjective: Patient is a 87 y.o. male PMHx DM type II presenting to the office today for routine foot care of callus lesions of the bilateral feet and elongated toenails to the bilateral feet. Walking increases the pain.  He is unable to trim his own nails. Patient presents today for further treatment and evaluation.  Past Medical History:  Diagnosis Date   Anemia    B12 deficiency    Bradycardia    Chronic kidney disease    chronic renal insufficency   Diabetes mellitus without complication (HCC)    Diverticulosis 01/25/2016   GERD (gastroesophageal reflux disease)    Hyperkalemia    Hyperlipidemia    Hypertension    Impotence    Pneumonia    PVC's (premature ventricular contractions)    Sleep apnea    no CPAP in 15 years   Wears dentures    partial lower    Objective:  Physical Exam General: Alert and oriented x3 in no acute distress  Dermatology: Hyperkeratotic lesions present on the bilateral feet. Pain on palpation with a central nucleated core noted. Skin is warm, dry and supple bilateral lower extremities. Negative for open lesions or macerations. Nails are tender, long, thickened and dystrophic with subungual debris, consistent with onychomycosis, 1-5 bilateral. No signs of infection noted.  Vascular: Palpable pedal pulses bilaterally. No edema or erythema noted. Capillary refill within normal limits.  Neurological: Epicritic and protective threshold diminished bilaterally.   Musculoskeletal Exam: Pain on palpation at the keratotic lesion noted. Range of motion within normal limits bilateral. Muscle strength 5/5 in all groups bilateral.  Hammertoe contracture of the left second toe with overlap onto the hallux.  Hallux valgus deformity also noted  Assessment: 1.  Pain due to onychomycosis of toenails both  2.  Pre-ulcerative callus lesions noted to the  bilateral feet x 4 3.  Hallux valgus and hammertoe deformity bilateral  Plan of Care:  1. Patient evaluated.  Comprehensive diabetic foot exam performed today 2. Excisional debridement of keratoic lesion using a chisel blade was performed without incident as a courtesy for the patient.  3. Dressed with light dressing. 4. Mechanical debridement of nails 1-5 bilaterally performed using a nail nipper. Filed with dremel without incident.  5.  Continue diabetic shoes and insoles 6.  Patient is to return to the clinic in 3 months.   Felecia Shelling, DPM [facility]  Dr. Felecia Shelling, DPM    [address] [place], [place] [address]                Office [phone]  Fax [phone]

## 2023-02-09 ENCOUNTER — Encounter: Payer: Self-pay | Admitting: Podiatry

## 2023-02-09 ENCOUNTER — Ambulatory Visit (INDEPENDENT_AMBULATORY_CARE_PROVIDER_SITE_OTHER): Payer: Medicare Other | Admitting: Podiatry

## 2023-02-09 DIAGNOSIS — M79675 Pain in left toe(s): Secondary | ICD-10-CM

## 2023-02-09 DIAGNOSIS — M79674 Pain in right toe(s): Secondary | ICD-10-CM

## 2023-02-09 DIAGNOSIS — M2042 Other hammer toe(s) (acquired), left foot: Secondary | ICD-10-CM

## 2023-02-09 DIAGNOSIS — B351 Tinea unguium: Secondary | ICD-10-CM | POA: Diagnosis not present

## 2023-02-09 NOTE — Progress Notes (Signed)
   Chief Complaint  Patient presents with   Diabetes    "Trim my nails."    Subjective: Patient is a 87 y.o. male PMHx DM type II presenting to the office today for routine foot care of callus lesions of the bilateral feet and elongated toenails to the bilateral feet. Walking increases the pain.  He is unable to trim his own nails. Patient presents today for further treatment and evaluation.  Past Medical History:  Diagnosis Date   Anemia    B12 deficiency    Bradycardia    Chronic kidney disease    chronic renal insufficency   Diabetes mellitus without complication (HCC)    Diverticulosis 01/25/2016   GERD (gastroesophageal reflux disease)    Hyperkalemia    Hyperlipidemia    Hypertension    Impotence    Pneumonia    PVC's (premature ventricular contractions)    Sleep apnea    no CPAP in 15 years   Wears dentures    partial lower    Objective:  Physical Exam General: Alert and oriented x3 in no acute distress  Dermatology: Hyperkeratotic lesions present on the bilateral feet. Pain on palpation with a central nucleated core noted. Skin is warm, dry and supple bilateral lower extremities. Negative for open lesions or macerations. Nails are tender, long, thickened and dystrophic with subungual debris, consistent with onychomycosis, 1-5 bilateral. No signs of infection noted.  Vascular: Palpable pedal pulses bilaterally. No edema or erythema noted. Capillary refill within normal limits.  Neurological: Grossly intact via light touch  Musculoskeletal Exam: Unchanged pain on palpation at the keratotic lesion noted. Range of motion within normal limits bilateral. Muscle strength 5/5 in all groups bilateral.  Hammertoe contracture of the left second toe with overlap onto the hallux.  Hallux valgus deformity also noted  Assessment: 1.  Pain due to onychomycosis of toenails both  2.  Pre-ulcerative callus lesions noted to the bilateral feet x2 3.  Hallux valgus and hammertoe  deformity bilateral  Plan of Care:  -Patient evaluated -Mechanical debridement of nails 1-5 bilateral was performed using a nail nipper without incident incident -Excisional debridement of the hyperkeratotic callus lesions was performed today with the 312 scalpel without incident or bleeding -Continue conservative management for the hammertoe deformity.  It is minimally symptomatic -Return to clinic 3 months routine footcare  Felecia Shelling, DPM [facility]  Dr. Felecia Shelling, DPM    [address] [place], [place] [address]                Office [phone]  Fax [phone]

## 2023-05-11 ENCOUNTER — Encounter: Payer: Self-pay | Admitting: Podiatry

## 2023-05-11 ENCOUNTER — Ambulatory Visit: Payer: Medicare Other | Admitting: Podiatry

## 2023-05-11 DIAGNOSIS — M2011 Hallux valgus (acquired), right foot: Secondary | ICD-10-CM

## 2023-05-11 DIAGNOSIS — M2012 Hallux valgus (acquired), left foot: Secondary | ICD-10-CM

## 2023-05-11 DIAGNOSIS — B351 Tinea unguium: Secondary | ICD-10-CM

## 2023-05-11 DIAGNOSIS — M79675 Pain in left toe(s): Secondary | ICD-10-CM

## 2023-05-11 DIAGNOSIS — M79674 Pain in right toe(s): Secondary | ICD-10-CM | POA: Diagnosis not present

## 2023-05-11 NOTE — Progress Notes (Signed)
   Chief Complaint  Patient presents with   Diabetes    "Trim my toenails."    Subjective: Patient is a 88 y.o. male PMHx DM type II presenting to the office today for routine foot care of callus lesions of the bilateral feet and elongated toenails to the bilateral feet. Walking increases the pain.  He is unable to trim his own nails. Patient presents today for further treatment and evaluation.  Past Medical History:  Diagnosis Date   Anemia    B12 deficiency    Bradycardia    Chronic kidney disease    chronic renal insufficency   Diabetes mellitus without complication (HCC)    Diverticulosis 01/25/2016   GERD (gastroesophageal reflux disease)    Hyperkalemia    Hyperlipidemia    Hypertension    Impotence    Pneumonia    PVC's (premature ventricular contractions)    Sleep apnea    no CPAP in 15 years   Wears dentures    partial lower    Objective:  Physical Exam General: Alert and oriented x3 in no acute distress  Dermatology: Hyperkeratotic lesions present on the bilateral feet. Pain on palpation with a central nucleated core noted. Skin is warm, dry and supple bilateral lower extremities. Negative for open lesions or macerations. Nails are tender, long, thickened and dystrophic with subungual debris, consistent with onychomycosis, 1-5 bilateral. No signs of infection noted.  Vascular: Palpable pedal pulses bilaterally. No edema or erythema noted. Capillary refill within normal limits.  Neurological: Grossly intact via light touch  Musculoskeletal Exam: Unchanged pain on palpation at the keratotic lesion noted. Range of motion within normal limits bilateral. Muscle strength 5/5 in all groups bilateral.  Hammertoe contracture of the left second toe with overlap onto the hallux.  Hallux valgus deformity also noted  Assessment: 1.  Pain due to onychomycosis of toenails both  2.  Pre-ulcerative callus lesions noted to the bilateral feet x2 3.  Hallux valgus and hammertoe  deformity bilateral  Plan of Care:  -Patient evaluated -Mechanical debridement of nails 1-5 bilateral was performed using a nail nipper without incident incident -Excisional debridement of the hyperkeratotic callus lesions was performed today with the 312 scalpel without incident or bleeding -Continue conservative management for the hammertoe and severe hallux valgus deformity.  It is minimally symptomatic and managed with appropriate shoes -Return to clinic 3 months routine footcare  Felecia Shelling, DPM [facility]  Dr. Felecia Shelling, DPM    [address] [place], [place] [address]                Office [phone]  Fax [phone]

## 2023-08-14 ENCOUNTER — Encounter: Payer: Self-pay | Admitting: Podiatry

## 2023-08-14 ENCOUNTER — Ambulatory Visit (INDEPENDENT_AMBULATORY_CARE_PROVIDER_SITE_OTHER): Admitting: Podiatry

## 2023-08-14 VITALS — Ht 69.0 in | Wt 192.0 lb

## 2023-08-14 DIAGNOSIS — B351 Tinea unguium: Secondary | ICD-10-CM

## 2023-08-14 DIAGNOSIS — M79674 Pain in right toe(s): Secondary | ICD-10-CM | POA: Diagnosis not present

## 2023-08-14 DIAGNOSIS — M79675 Pain in left toe(s): Secondary | ICD-10-CM | POA: Diagnosis not present

## 2023-08-14 NOTE — Progress Notes (Signed)
   Chief Complaint  Patient presents with   Nail Problem    DFC.    Subjective: Patient is a 88 y.o. male PMHx DM type II presenting to the office today for routine foot care of callus lesions of the bilateral feet and elongated toenails to the bilateral feet. Walking increases the pain.  He is unable to trim his own nails. Patient presents today for further treatment and evaluation.  Past Medical History:  Diagnosis Date   Anemia    B12 deficiency    Bradycardia    Chronic kidney disease    chronic renal insufficency   Diabetes mellitus without complication (HCC)    Diverticulosis 01/25/2016   GERD (gastroesophageal reflux disease)    Hyperkalemia    Hyperlipidemia    Hypertension    Impotence    Pneumonia    PVC's (premature ventricular contractions)    Sleep apnea    no CPAP in 15 years   Wears dentures    partial lower    Objective:  Physical Exam General: Alert and oriented x3 in no acute distress  Dermatology: Hyperkeratotic lesions present on the bilateral feet. Pain on palpation with a central nucleated core noted. Skin is warm, dry and supple bilateral lower extremities. Negative for open lesions or macerations. Nails are tender, long, thickened and dystrophic with subungual debris, consistent with onychomycosis, 1-5 bilateral. No signs of infection noted.  Vascular: Palpable pedal pulses bilaterally. No edema or erythema noted. Capillary refill within normal limits.  Neurological: Grossly intact via light touch  Musculoskeletal Exam: Unchanged pain on palpation at the keratotic lesion noted. Range of motion within normal limits bilateral. Muscle strength 5/5 in all groups bilateral.  Hammertoe contracture of the left second toe with overlap onto the hallux.  Hallux valgus deformity also noted  Assessment: 1.  Pain due to onychomycosis of toenails both  2.  Pre-ulcerative callus lesions noted to the bilateral feet x2 3.  Hallux valgus and hammertoe deformity  bilateral  Plan of Care:  -Patient evaluated -Mechanical debridement of nails 1-5 bilateral was performed using a nail nipper without incident incident -Excisional debridement of the hyperkeratotic callus lesions was performed today with the 312 scalpel without incident or bleeding -Continue conservative management for the hammertoe and severe hallux valgus deformity.  It is minimally symptomatic and managed with appropriate shoes -Return to clinic 3 months routine footcare  Thresa EMERSON Sar, DPM [facility]  Dr. Thresa EMERSON Sar, DPM    [address] [place], [place] [address]                Office [phone]  Fax [phone]

## 2023-10-25 ENCOUNTER — Inpatient Hospital Stay: Attending: Oncology | Admitting: Oncology

## 2023-10-25 ENCOUNTER — Encounter: Payer: Self-pay | Admitting: Oncology

## 2023-10-25 ENCOUNTER — Inpatient Hospital Stay

## 2023-10-25 VITALS — BP 128/80 | HR 77 | Temp 96.4°F | Resp 18 | Wt 183.8 lb

## 2023-10-25 DIAGNOSIS — D378 Neoplasm of uncertain behavior of other specified digestive organs: Secondary | ICD-10-CM | POA: Diagnosis not present

## 2023-10-25 DIAGNOSIS — R978 Other abnormal tumor markers: Secondary | ICD-10-CM

## 2023-10-25 DIAGNOSIS — D696 Thrombocytopenia, unspecified: Secondary | ICD-10-CM | POA: Insufficient documentation

## 2023-10-25 DIAGNOSIS — Z79899 Other long term (current) drug therapy: Secondary | ICD-10-CM | POA: Insufficient documentation

## 2023-10-25 DIAGNOSIS — K862 Cyst of pancreas: Secondary | ICD-10-CM

## 2023-10-25 DIAGNOSIS — Z85828 Personal history of other malignant neoplasm of skin: Secondary | ICD-10-CM

## 2023-10-25 DIAGNOSIS — Z7289 Other problems related to lifestyle: Secondary | ICD-10-CM

## 2023-10-25 DIAGNOSIS — Z114 Encounter for screening for human immunodeficiency virus [HIV]: Secondary | ICD-10-CM

## 2023-10-25 DIAGNOSIS — R5383 Other fatigue: Secondary | ICD-10-CM

## 2023-10-25 LAB — CBC WITH DIFFERENTIAL/PLATELET
Abs Immature Granulocytes: 0.02 K/uL (ref 0.00–0.07)
Basophils Absolute: 0 K/uL (ref 0.0–0.1)
Basophils Relative: 0 %
Eosinophils Absolute: 0.1 K/uL (ref 0.0–0.5)
Eosinophils Relative: 1 %
HCT: 42.6 % (ref 39.0–52.0)
Hemoglobin: 14.3 g/dL (ref 13.0–17.0)
Immature Granulocytes: 0 %
Lymphocytes Relative: 40 %
Lymphs Abs: 2.5 K/uL (ref 0.7–4.0)
MCH: 33 pg (ref 26.0–34.0)
MCHC: 33.6 g/dL (ref 30.0–36.0)
MCV: 98.4 fL (ref 80.0–100.0)
Monocytes Absolute: 0.6 K/uL (ref 0.1–1.0)
Monocytes Relative: 10 %
Neutro Abs: 3.1 K/uL (ref 1.7–7.7)
Neutrophils Relative %: 49 %
Platelets: 212 K/uL (ref 150–400)
RBC: 4.33 MIL/uL (ref 4.22–5.81)
RDW: 13.2 % (ref 11.5–15.5)
WBC: 6.3 K/uL (ref 4.0–10.5)
nRBC: 0 % (ref 0.0–0.2)

## 2023-10-25 LAB — FOLATE: Folate: >20 ng/mL (ref >5.9)

## 2023-10-25 LAB — TECHNOLOGIST SMEAR REVIEW: Plt Morphology: ADEQUATE

## 2023-10-25 LAB — IMMATURE PLATELET FRACTION: Immature Platelet Fraction: 1.4 % (ref 1.2–8.6)

## 2023-10-25 LAB — HIV ANTIBODY (ROUTINE TESTING W REFLEX): HIV Screen 4th Generation wRfx: NONREACTIVE

## 2023-10-25 LAB — HEPATITIS PANEL, ACUTE
HCV Ab: NONREACTIVE
Hep A IgM: NONREACTIVE
Hep B C IgM: NONREACTIVE
Hepatitis B Surface Ag: NONREACTIVE

## 2023-10-25 LAB — VITAMIN B12: Vitamin B-12: 420 pg/mL (ref 180–914)

## 2023-10-25 NOTE — Assessment & Plan Note (Signed)
 New onset of thrombocytopenia the past 8 months For the work up of patient's thrombocytopenia, I recommend checking CBC; immature platelet fraction, LDH; smear review, folate, Vitamin B12, hepatitis, HIV, flowcytometry and monoclonal gammopathy workup.

## 2023-10-25 NOTE — Progress Notes (Signed)
 Hematology/Oncology Consult note [phone] [phone]        REFERRING PROVIDER: Jyl Railing, MD   CHIEF COMPLAINTS/REASON FOR VISIT:  Evaluation of thrombocytopenia   ASSESSMENT & PLAN:   Thrombocytopenia (HCC) New onset of thrombocytopenia the past 8 months For the work up of patient's thrombocytopenia, I recommend checking CBC; immature platelet fraction, LDH; smear review, folate, Vitamin B12, hepatitis, HIV, flowcytometry and monoclonal gammopathy workup.    Pancreatic cyst Pancreatic cyst, possible IPMN.  Repeat MRI abdomen with and without contrast.   Orders Placed This Encounter  Procedures   MR Abdomen W Wo Contrast    Standing Status:   Future    Expected Date:   11/01/2023    Expiration Date:   10/24/2024    If indicated for the ordered procedure, I authorize the administration of contrast media per Radiology protocol:   Yes    What is the patient's sedation requirement?:   No Sedation    Does the patient have a pacemaker or implanted devices?:   No    Preferred imaging location?:   [facility] (table limit - 500lbs)   Technologist smear review    Standing Status:   Future    Number of Occurrences:   1    Expected Date:   10/25/2023    Expiration Date:   01/23/2024   CBC with Differential/Platelet    Standing Status:   Future    Number of Occurrences:   1    Expected Date:   10/25/2023    Expiration Date:   01/23/2024   Immature Platelet Fraction    Standing Status:   Future    Number of Occurrences:   1    Expected Date:   10/25/2023    Expiration Date:   01/23/2024   ANA, IFA (with reflex)    Standing Status:   Future    Number of Occurrences:   1    Expected Date:   10/25/2023    Expiration Date:   01/23/2024   Flow cytometry panel-leukemia/lymphoma work-up    Standing Status:   Future    Number of Occurrences:   1    Expected Date:   10/25/2023    Expiration Date:   01/23/2024   Vitamin B12    Standing Status:   Future     Number of Occurrences:   1    Expected Date:   10/25/2023    Expiration Date:   01/23/2024   Folate    Standing Status:   Future    Number of Occurrences:   1    Expected Date:   10/25/2023    Expiration Date:   01/23/2024   Multiple Myeloma Panel (SPEP&IFE w/QIG)    Standing Status:   Future    Number of Occurrences:   1    Expected Date:   10/25/2023    Expiration Date:   01/23/2024   Kappa/lambda light chains    Standing Status:   Future    Number of Occurrences:   1    Expected Date:   10/25/2023    Expiration Date:   01/23/2024   Cancer antigen 19-9    Standing Status:   Future    Number of Occurrences:   1    Expected Date:   10/25/2023    Expiration Date:   01/23/2024   HIV Antibody (routine testing w rflx)    Standing Status:   Future    Number of Occurrences:   1  Expected Date:   10/25/2023    Expiration Date:   01/23/2024   Hepatitis panel, acute    Standing Status:   Future    Number of Occurrences:   1    Expected Date:   10/25/2023    Expiration Date:   01/23/2024    All questions were answered. The patient knows to call the clinic with any problems, questions or concerns.  Zelphia Cap, MD, PhD [facility] Hematology Oncology 10/25/2023   HISTORY OF PRESENTING ILLNESS:   Jermaine White is a  88 y.o.  male with PMH listed below was seen in consultation at the request of  Jyl Railing, MD  for evaluation of thrombocytopenia.  Discussed the use of AI scribe software for clinical note transcription with the patient, who gave verbal consent to proceed.   January 2025, patient was found to have new onset of thrombocytopenia repeated count 107,000. Subsequent tests showed an increase to 141,000 in July 2025, followed by a slight decrease to 136,000/L in August 2025. No significant events such as infections, hospitalizations, or new medications were noted around the time the low platelet count was first observed.  He underwent eye surgery for a detached retina in September  2023. He has been on Jardiance for over a year and takes vitamin B12.  No symptoms of autoimmune diseases such as lupus or rheumatoid arthritis. No unintentional weight loss, excessive night sweats, fever, or systemic symptoms like abdominal pain, easy bleeding, or bruising. He reports back discomfort and has had two back surgeries.  He denies alcohol use and has never been a heavy drinker. Patient denies any bleeding events.  MEDICAL HISTORY:  Past Medical History:  Diagnosis Date   Anemia    B12 deficiency    Bradycardia    Chronic kidney disease    chronic renal insufficency   Diabetes mellitus without complication (HCC)    Diverticulosis 01/25/2016   GERD (gastroesophageal reflux disease)    Hyperkalemia    Hyperlipidemia    Hypertension    Impotence    Pneumonia    PVC's (premature ventricular contractions)    Sleep apnea    no CPAP in 15 years   Wears dentures    partial lower    SURGICAL HISTORY: Past Surgical History:  Procedure Laterality Date   BACK SURGERY  10/18/2016   BROW LIFT Bilateral 12/10/2020   Procedure: BLEPHAROPLASTY UPPER EYELID; W/ EXCESS SKIN + BLEPHAROPTOSIS REPAIR; RESECT EX BILATERAL;  Surgeon: Ashley Greig HERO, MD;  Location: [facility];  Service: Ophthalmology;  Laterality: Bilateral;  Diabetic   CATARACT EXTRACTION W/PHACO Right 02/19/2020   Procedure: CATARACT EXTRACTION PHACO AND INTRAOCULAR LENS PLACEMENT (IOC) RIGHT DIABETIC PANOPTIX LENS;  Surgeon: Myrna Adine Anes, MD;  Location: [facility];  Service: Ophthalmology;  Laterality: Right;  5.89 0:41.5   CATARACT EXTRACTION W/PHACO Left 03/01/2020   Procedure: CATARACT EXTRACTION PHACO AND INTRAOCULAR LENS PLACEMENT (IOC) LEFT DIABETIC PANOPTIX LENS;  Surgeon: Myrna Adine Anes, MD;  Location: [facility];  Service: Ophthalmology;  Laterality: Left;  8.61 0:51.2   COLONOSCOPY     COLONOSCOPY WITH PROPOFOL  N/A 01/25/2016   Procedure: COLONOSCOPY WITH PROPOFOL ;   Surgeon: Gladis RAYMOND Mariner, MD;  Location: [facility];  Service: Endoscopy;  Laterality: N/A;   HERNIA REPAIR     x3   KNEE ARTHROSCOPY Left 03/26/2017   Procedure: ARTHROSCOPY KNEE, PARTIAL MEDIAL MENISECTOMY, CHONDROPLASTY;  Surgeon: Mardee Lynwood SQUIBB, MD;  Location: [facility];  Service: Orthopedics;  Laterality: Left;  LUMBAR LAMINECTOMY/DECOMPRESSION MICRODISCECTOMY Left 10/18/2016   Procedure: LAMINECTOMY AND FORAMINOTOMY LEFT LUMBAR THREE- LUMBAR FOUR, LUMBAR FOUR- LUMBAR FIVE;  Surgeon: Mavis Purchase, MD;  Location: [facility] OR;  Service: Neurosurgery;  Laterality: Left;   MOHS SURGERY Right 01/2020   Forearm, just below elbow    SOCIAL HISTORY: Social History   Socioeconomic History   Marital status: Married    Spouse name: Not on file   Number of children: Not on file   Years of education: Not on file   Highest education level: Not on file  Occupational History   Not on file  Tobacco Use   Smoking status: Never   Smokeless tobacco: Never  Vaping Use   Vaping status: Never Used  Substance and Sexual Activity   Alcohol use: No    Alcohol/week: 0.0 standard drinks of alcohol   Drug use: No   Sexual activity: Not on file  Other Topics Concern   Not on file  Social History Narrative   Not on file   Social Drivers of Health   Financial Resource Strain: Low Risk  (10/25/2023)   Overall Financial Resource Strain (CARDIA)    Difficulty of Paying Living Expenses: Not very hard  Food Insecurity: No Food Insecurity (10/25/2023)   Hunger Vital Sign    Worried About Running Out of Food in the Last Year: Never true    Ran Out of Food in the Last Year: Never true  Transportation Needs: No Transportation Needs (10/25/2023)   PRAPARE - Administrator, Civil Service (Medical): No    Lack of Transportation (Non-Medical): No  Physical Activity: Not on file  Stress: No Stress Concern Present (10/25/2023)   [employer] of Occupational Health - Occupational Stress  Questionnaire    Feeling of Stress: Not at all  Social Connections: Not on file  Intimate Partner Violence: Not At Risk (10/25/2023)   Humiliation, Afraid, Rape, and Kick questionnaire    Fear of Current or Ex-Partner: No    Emotionally Abused: No    Physically Abused: No    Sexually Abused: No    FAMILY HISTORY: Family History  Problem Relation Age of Onset   Diabetes Mother    Heart disease Father     ALLERGIES:  is allergic to sulfa antibiotics.  MEDICATIONS:  Current Outpatient Medications  Medication Sig Dispense Refill   ACCU-CHEK COMPACT PLUS test strip CHECK BLOOD SUGAR TWICE A DAY. (DX E11.29)  4   aspirin EC 81 MG tablet Take 81 mg by mouth at bedtime.     Black Elderberry 50 MG/5ML SYRP Take by mouth daily.     calcium carbonate (TUMS - DOSED IN MG ELEMENTAL CALCIUM) 500 MG chewable tablet Chew 1-2 tablets by mouth 2 (two) times daily as needed for indigestion or heartburn.     erythromycin  ophthalmic ointment Apply to sutures 4 times a day for 10-12 days.  Discontinue if allergy develops and call our office 3.5 g 2   fluticasone (FLONASE) 50 MCG/ACT nasal spray      gentamicin  cream (GARAMYCIN ) 0.1 % Apply 1 application topically 2 (two) times daily. 30 g 2   glipiZIDE  (GLUCOTROL  XL) 5 MG 24 hr tablet Take 10 mg by mouth 2 (two) times daily.     Glucosamine-Chondroitin (COSAMIN DS PO) Take 1 tablet by mouth 2 (two) times daily.     glucose blood (ACCU-CHEK COMPACT PLUS) test strip CHECK BLOOD SUGAR TWICE A DAY. (DX E11.29)     hydrochlorothiazide  (HYDRODIURIL ) 12.5 MG  tablet      lisinopril  (ZESTRIL ) 5 MG tablet 5 mg.     lovastatin (MEVACOR) 40 MG tablet Take 40 mg by mouth at bedtime.      metFORMIN  (GLUCOPHAGE -XR) 500 MG 24 hr tablet Take 500 mg by mouth 2 (two) times daily.      Multiple Vitamins-Minerals (ZINC  PO) Take by mouth daily.     Probiotic Product (PROBIOTIC PO) Take by mouth.     traMADol  (ULTRAM ) 50 MG tablet Take 1 every 4-6 hours as needed for pain  not controlled by Tylenol  6 tablet 0   vitamin B-12 (CYANOCOBALAMIN) 1000 MCG tablet Take 1,000 mcg by mouth daily.     No current facility-administered medications for this visit.    Review of Systems  Constitutional:  Negative for appetite change, chills, fatigue and fever.  HENT:   Negative for hearing loss and voice change.   Eyes:  Negative for eye problems.  Respiratory:  Negative for chest tightness and cough.   Cardiovascular:  Negative for chest pain.  Gastrointestinal:  Negative for abdominal distention, abdominal pain and blood in stool.  Endocrine: Negative for hot flashes.  Genitourinary:  Negative for difficulty urinating and frequency.   Musculoskeletal:  Positive for back pain. Negative for arthralgias.  Skin:  Negative for itching and rash.  Neurological:  Negative for extremity weakness.  Hematological:  Negative for adenopathy.  Psychiatric/Behavioral:  Negative for confusion.    PHYSICAL EXAMINATION:  Vitals:   10/25/23 1124 10/25/23 1140  BP: (!) 135/100 128/80  Pulse: 77   Resp: 18   Temp: (!) 96.4 F (35.8 C)   SpO2: 100%    Filed Weights   10/25/23 1124  Weight: 183 lb 12.8 oz (83.4 kg)    Physical Exam Constitutional:      General: He is not in acute distress. HENT:     Head: Normocephalic and atraumatic.  Eyes:     General: No scleral icterus. Cardiovascular:     Rate and Rhythm: Normal rate and regular rhythm.     Heart sounds: Normal heart sounds.  Pulmonary:     Effort: Pulmonary effort is normal. No respiratory distress.     Breath sounds: No wheezing.  Abdominal:     General: Bowel sounds are normal. There is no distension.     Palpations: Abdomen is soft.  Musculoskeletal:        General: No deformity. Normal range of motion.     Cervical back: Normal range of motion and neck supple.  Skin:    General: Skin is warm and dry.     Findings: No erythema or rash.  Neurological:     Mental Status: He is alert and oriented to  person, place, and time. Mental status is at baseline.     Cranial Nerves: No cranial nerve deficit.     Coordination: Coordination normal.  Psychiatric:        Mood and Affect: Mood normal.     LABORATORY DATA:  I have reviewed the data as listed    Latest Ref Rng & Units 10/25/2023   12:30 PM 03/20/2017   11:19 AM 10/16/2016    3:23 PM  CBC  WBC 4.0 - 10.5 K/uL 6.3  6.1  6.7   Hemoglobin 13.0 - 17.0 g/dL 85.6  87.0  86.5   Hematocrit 39.0 - 52.0 % 42.6  38.5  40.9   Platelets 150 - 400 K/uL 212  184  207       Latest  Ref Rng & Units 03/20/2017   11:19 AM 10/16/2016    3:23 PM  CMP  Glucose 65 - 99 mg/dL 838  882   BUN 6 - 20 mg/dL 22  23   Creatinine 9.38 - 1.24 mg/dL 8.76  8.68   Sodium 864 - 145 mmol/L 139  139   Potassium 3.5 - 5.1 mmol/L 4.1  5.1   Chloride 101 - 111 mmol/L 105  105   CO2 22 - 32 mmol/L 26  24   Calcium 8.9 - 10.3 mg/dL 8.5  9.3       RADIOGRAPHIC STUDIES: I have personally reviewed the radiological images as listed and agreed with the findings in the report. No results found.

## 2023-10-25 NOTE — Assessment & Plan Note (Signed)
 Pancreatic cyst, possible IPMN.  Repeat MRI abdomen with and without contrast.

## 2023-10-26 LAB — CANCER ANTIGEN 19-9: CA 19-9: <2 U/mL (ref 0–35)

## 2023-10-26 LAB — ANTINUCLEAR ANTIBODIES, IFA: ANA Ab, IFA: NEGATIVE

## 2023-10-26 LAB — KAPPA/LAMBDA LIGHT CHAINS
Kappa free light chain: 27.6 mg/L — ABNORMAL HIGH (ref 3.3–19.4)
Kappa, lambda light chain ratio: 1.48 (ref 0.26–1.65)
Lambda free light chains: 18.7 mg/L (ref 5.7–26.3)

## 2023-10-28 ENCOUNTER — Ambulatory Visit
Admission: RE | Admit: 2023-10-28 | Discharge: 2023-10-28 | Disposition: A | Discharge status: Home / Self Care | Source: Ambulatory Visit | Attending: Oncology | Admitting: Oncology

## 2023-10-28 DIAGNOSIS — K862 Cyst of pancreas: Secondary | ICD-10-CM | POA: Diagnosis present

## 2023-10-28 MED ORDER — GADOBUTROL 1 MMOL/ML IV SOLN
7.5000 mL | Freq: Once | INTRAVENOUS | Status: AC | PRN
  Administered 2023-10-28: 7.5 mL via INTRAVENOUS

## 2023-10-29 LAB — MULTIPLE MYELOMA PANEL, SERUM
Albumin SerPl Elph-Mcnc: 3.8 g/dL (ref 2.9–4.4)
Albumin/Glob SerPl: 1.5 (ref 0.7–1.7)
Alpha 1: 0.2 g/dL (ref 0.0–0.4)
Alpha2 Glob SerPl Elph-Mcnc: 0.8 g/dL (ref 0.4–1.0)
B-Globulin SerPl Elph-Mcnc: 0.9 g/dL (ref 0.7–1.3)
Gamma Glob SerPl Elph-Mcnc: 0.7 g/dL (ref 0.4–1.8)
Globulin, Total: 2.6 g/dL (ref 2.2–3.9)
IgA: 202 mg/dL (ref 61–437)
IgG (Immunoglobin G), Serum: 802 mg/dL (ref 603–1613)
IgM (Immunoglobulin M), Srm: 40 mg/dL (ref 15–143)
Total Protein ELP: 6.4 g/dL (ref 6.0–8.5)

## 2023-10-29 LAB — COMP PANEL: LEUKEMIA/LYMPHOMA

## 2023-11-13 ENCOUNTER — Encounter: Payer: Self-pay | Admitting: Podiatry

## 2023-11-13 ENCOUNTER — Ambulatory Visit (INDEPENDENT_AMBULATORY_CARE_PROVIDER_SITE_OTHER): Admitting: Podiatry

## 2023-11-13 VITALS — Ht 69.0 in | Wt 183.8 lb

## 2023-11-13 DIAGNOSIS — B351 Tinea unguium: Secondary | ICD-10-CM | POA: Diagnosis not present

## 2023-11-13 DIAGNOSIS — M79675 Pain in left toe(s): Secondary | ICD-10-CM

## 2023-11-13 DIAGNOSIS — M79674 Pain in right toe(s): Secondary | ICD-10-CM | POA: Diagnosis not present

## 2023-11-13 NOTE — Progress Notes (Signed)
   Chief Complaint  Patient presents with   Nail Problem    DFC    Subjective: Patient is a 88 y.o. male PMHx DM type II presenting to the office today for routine foot care of callus lesions of the bilateral feet and elongated toenails to the bilateral feet. Walking increases the pain.  He is unable to trim his own nails. Patient presents today for further treatment and evaluation.  Past Medical History:  Diagnosis Date   Anemia    B12 deficiency    Bradycardia    Chronic kidney disease    chronic renal insufficency   Diabetes mellitus without complication (HCC)    Diverticulosis 01/25/2016   GERD (gastroesophageal reflux disease)    Hyperkalemia    Hyperlipidemia    Hypertension    Impotence    Pneumonia    PVC's (premature ventricular contractions)    Sleep apnea    no CPAP in 15 years   Wears dentures    partial lower    Objective:  Physical Exam General: Alert and oriented x3 in no acute distress  Dermatology: Hyperkeratotic lesions present on the bilateral feet. Pain on palpation with a central nucleated core noted. Skin is warm, dry and supple bilateral lower extremities. Negative for open lesions or macerations. Nails are tender, long, thickened and dystrophic with subungual debris, consistent with onychomycosis, 1-5 bilateral. No signs of infection noted.  Vascular: Palpable pedal pulses bilaterally. No edema or erythema noted. Capillary refill within normal limits.  Neurological: Grossly intact via light touch  Musculoskeletal Exam: Unchanged pain on palpation at the keratotic lesion noted. Range of motion within normal limits bilateral. Muscle strength 5/5 in all groups bilateral.  Hammertoe contracture of the left second toe with overlap onto the hallux.  Hallux valgus deformity also noted  Assessment: 1.  Pain due to onychomycosis of toenails both  2.  Pre-ulcerative callus lesions noted to the bilateral feet x2 3.  Hallux valgus and hammertoe deformity  bilateral  Plan of Care:  -Patient evaluated -Mechanical debridement of nails 1-5 bilateral was performed using a nail nipper without incident incident -Excisional debridement of the hyperkeratotic callus lesions was performed today with the 312 scalpel without incident or bleeding -Continue conservative management for the hammertoe and severe hallux valgus deformity.  It is minimally symptomatic and managed with appropriate shoes -Return to clinic 3 months routine footcare  Thresa EMERSON Sar, DPM [facility]  Dr. Thresa EMERSON Sar, DPM    [address] [place], [place] [address]                Office [phone]  Fax [phone]

## 2023-11-20 ENCOUNTER — Inpatient Hospital Stay: Admitting: Oncology

## 2023-11-20 ENCOUNTER — Encounter: Payer: Self-pay | Admitting: Oncology

## 2023-11-20 VITALS — BP 113/67 | HR 87 | Temp 96.0°F | Resp 18 | Wt 183.5 lb

## 2023-11-20 DIAGNOSIS — K862 Cyst of pancreas: Secondary | ICD-10-CM | POA: Diagnosis not present

## 2023-11-20 DIAGNOSIS — D696 Thrombocytopenia, unspecified: Secondary | ICD-10-CM | POA: Diagnosis not present

## 2023-11-20 NOTE — Assessment & Plan Note (Signed)
 Pancreatic cyst, possible IPMN.  MRI abdomen with and without contrast showed stable small size.  Given his age, stability, I will order additional MRI surveillance.

## 2023-11-20 NOTE — Assessment & Plan Note (Signed)
 Repeat CBC showed normal platelet count.  No thrombocytopenia. Workup is negative.  Results were reviewed and discussed with patient.  I will hold off additional workup at this point.

## 2023-11-20 NOTE — Progress Notes (Signed)
 Hematology/Oncology Consult note [phone] [phone]        REFERRING PROVIDER: Jyl Railing, MD   CHIEF COMPLAINTS/REASON FOR VISIT:  Evaluation of thrombocytopenia   ASSESSMENT & PLAN:   Thrombocytopenia Repeat CBC showed normal platelet count.  No thrombocytopenia. Workup is negative.  Results were reviewed and discussed with patient.  I will hold off additional workup at this point.  Pancreatic cyst Pancreatic cyst, possible IPMN.  MRI abdomen with and without contrast showed stable small size.  Given his age, stability, I will order additional MRI surveillance.   No orders of the defined types were placed in this encounter.   All questions were answered. The patient knows to call the clinic with any problems, questions or concerns.  Zelphia Cap, MD, PhD [facility] Hematology Oncology 11/20/2023   HISTORY OF PRESENTING ILLNESS:   Jermaine White is a  88 y.o.  male with PMH listed below was seen in consultation at the request of  Jyl Railing, MD  for evaluation of thrombocytopenia.  Discussed the use of AI scribe software for clinical note transcription with the patient, who gave verbal consent to proceed.   January 2025, patient was found to have new onset of thrombocytopenia repeated count 107,000. Subsequent tests showed an increase to 141,000 in July 2025, followed by a slight decrease to 136,000/L in August 2025. No significant events such as infections, hospitalizations, or new medications were noted around the time the low platelet count was first observed.  He underwent eye surgery for a detached retina in September 2023. He has been on Jardiance for over a year and takes vitamin B12.  No symptoms of autoimmune diseases such as lupus or rheumatoid arthritis. No unintentional weight loss, excessive night sweats, fever, or systemic symptoms like abdominal pain, easy bleeding, or bruising. He reports back discomfort and has had two  back surgeries.  He denies alcohol use and has never been a heavy drinker. Patient denies any bleeding events.   INTERVAL HISTORY Jermaine White is a 87 y.o. male who has above history reviewed by me today presents for follow up visit for thrombocytopenia.  Pancreatic cyst. Patient presents to discuss lab results and MRI results.  No new complaints.  Accompanied by wife.  MEDICAL HISTORY:  Past Medical History:  Diagnosis Date   Anemia    B12 deficiency    Bradycardia    Chronic kidney disease    chronic renal insufficency   Diabetes mellitus without complication (HCC)    Diverticulosis 01/25/2016   GERD (gastroesophageal reflux disease)    Hyperkalemia    Hyperlipidemia    Hypertension    Impotence    Pneumonia    PVC's (premature ventricular contractions)    Sleep apnea    no CPAP in 15 years   Wears dentures    partial lower    SURGICAL HISTORY: Past Surgical History:  Procedure Laterality Date   BACK SURGERY  10/18/2016   BROW LIFT Bilateral 12/10/2020   Procedure: BLEPHAROPLASTY UPPER EYELID; W/ EXCESS SKIN + BLEPHAROPTOSIS REPAIR; RESECT EX BILATERAL;  Surgeon: Ashley Greig HERO, MD;  Location: [facility];  Service: Ophthalmology;  Laterality: Bilateral;  Diabetic   CATARACT EXTRACTION W/PHACO Right 02/19/2020   Procedure: CATARACT EXTRACTION PHACO AND INTRAOCULAR LENS PLACEMENT (IOC) RIGHT DIABETIC PANOPTIX LENS;  Surgeon: Myrna Adine Anes, MD;  Location: [facility];  Service: Ophthalmology;  Laterality: Right;  5.89 0:41.5   CATARACT EXTRACTION W/PHACO Left 03/01/2020   Procedure: CATARACT EXTRACTION PHACO  AND INTRAOCULAR LENS PLACEMENT (IOC) LEFT DIABETIC PANOPTIX LENS;  Surgeon: Myrna Adine Anes, MD;  Location: [facility];  Service: Ophthalmology;  Laterality: Left;  8.61 0:51.2   COLONOSCOPY     COLONOSCOPY WITH PROPOFOL  N/A 01/25/2016   Procedure: COLONOSCOPY WITH PROPOFOL ;  Surgeon: Gladis RAYMOND Mariner, MD;  Location: [facility];   Service: Endoscopy;  Laterality: N/A;   HERNIA REPAIR     x3   KNEE ARTHROSCOPY Left 03/26/2017   Procedure: ARTHROSCOPY KNEE, PARTIAL MEDIAL MENISECTOMY, CHONDROPLASTY;  Surgeon: Mardee Lynwood SQUIBB, MD;  Location: [facility] ORS;  Service: Orthopedics;  Laterality: Left;   LUMBAR LAMINECTOMY/DECOMPRESSION MICRODISCECTOMY Left 10/18/2016   Procedure: LAMINECTOMY AND FORAMINOTOMY LEFT LUMBAR THREE- LUMBAR FOUR, LUMBAR FOUR- LUMBAR FIVE;  Surgeon: Mavis Purchase, MD;  Location: [facility] OR;  Service: Neurosurgery;  Laterality: Left;   MOHS SURGERY Right 01/2020   Forearm, just below elbow    SOCIAL HISTORY: Social History   Socioeconomic History   Marital status: Married    Spouse name: Not on file   Number of children: Not on file   Years of education: Not on file   Highest education level: Not on file  Occupational History   Not on file  Tobacco Use   Smoking status: Never   Smokeless tobacco: Never  Vaping Use   Vaping status: Never Used  Substance and Sexual Activity   Alcohol use: No    Alcohol/week: 0.0 standard drinks of alcohol   Drug use: No   Sexual activity: Not on file  Other Topics Concern   Not on file  Social History Narrative   Not on file   Social Drivers of Health   Financial Resource Strain: Low Risk  (10/25/2023)   Overall Financial Resource Strain (CARDIA)    Difficulty of Paying Living Expenses: Not very hard  Food Insecurity: No Food Insecurity (10/25/2023)   Hunger Vital Sign    Worried About Running Out of Food in the Last Year: Never true    Ran Out of Food in the Last Year: Never true  Transportation Needs: No Transportation Needs (10/25/2023)   PRAPARE - Administrator, Civil Service (Medical): No    Lack of Transportation (Non-Medical): No  Physical Activity: Not on file  Stress: No Stress Concern Present (10/25/2023)   [employer] of Occupational Health - Occupational Stress Questionnaire    Feeling of Stress: Not at all  Social Connections:  Not on file  Intimate Partner Violence: Not At Risk (10/25/2023)   Humiliation, Afraid, Rape, and Kick questionnaire    Fear of Current or Ex-Partner: No    Emotionally Abused: No    Physically Abused: No    Sexually Abused: No    FAMILY HISTORY: Family History  Problem Relation Age of Onset   Diabetes Mother    Heart disease Father     ALLERGIES:  is allergic to sulfa antibiotics.  MEDICATIONS:  Current Outpatient Medications  Medication Sig Dispense Refill   ACCU-CHEK COMPACT PLUS test strip CHECK BLOOD SUGAR TWICE A DAY. (DX E11.29)  4   aspirin EC 81 MG tablet Take 81 mg by mouth at bedtime.     Black Elderberry 50 MG/5ML SYRP Take by mouth daily.     calcium carbonate (TUMS - DOSED IN MG ELEMENTAL CALCIUM) 500 MG chewable tablet Chew 1-2 tablets by mouth 2 (two) times daily as needed for indigestion or heartburn.     erythromycin  ophthalmic ointment Apply to sutures 4 times a day for  10-12 days.  Discontinue if allergy develops and call our office 3.5 g 2   fluticasone (FLONASE) 50 MCG/ACT nasal spray      gentamicin  cream (GARAMYCIN ) 0.1 % Apply 1 application topically 2 (two) times daily. 30 g 2   glipiZIDE  (GLUCOTROL  XL) 5 MG 24 hr tablet Take 10 mg by mouth 2 (two) times daily.     Glucosamine-Chondroitin (COSAMIN DS PO) Take 1 tablet by mouth 2 (two) times daily.     glucose blood (ACCU-CHEK COMPACT PLUS) test strip CHECK BLOOD SUGAR TWICE A DAY. (DX E11.29)     hydrochlorothiazide  (HYDRODIURIL ) 12.5 MG tablet      lisinopril  (ZESTRIL ) 5 MG tablet 5 mg.     lovastatin (MEVACOR) 40 MG tablet Take 40 mg by mouth at bedtime.      metFORMIN  (GLUCOPHAGE -XR) 500 MG 24 hr tablet Take 500 mg by mouth 2 (two) times daily.      Multiple Vitamins-Minerals (ZINC  PO) Take by mouth daily.     Probiotic Product (PROBIOTIC PO) Take by mouth.     traMADol  (ULTRAM ) 50 MG tablet Take 1 every 4-6 hours as needed for pain not controlled by Tylenol  6 tablet 0   vitamin B-12 (CYANOCOBALAMIN)  1000 MCG tablet Take 1,000 mcg by mouth daily.     No current facility-administered medications for this visit.    Review of Systems  Constitutional:  Negative for appetite change, chills, fatigue and fever.  HENT:   Negative for hearing loss and voice change.   Eyes:  Negative for eye problems.  Respiratory:  Negative for chest tightness and cough.   Cardiovascular:  Negative for chest pain.  Gastrointestinal:  Negative for abdominal distention, abdominal pain and blood in stool.  Endocrine: Negative for hot flashes.  Genitourinary:  Negative for difficulty urinating and frequency.   Musculoskeletal:  Positive for back pain. Negative for arthralgias.  Skin:  Negative for itching and rash.  Neurological:  Negative for extremity weakness.  Hematological:  Negative for adenopathy.  Psychiatric/Behavioral:  Negative for confusion.    PHYSICAL EXAMINATION:  Vitals:   11/20/23 1406  BP: 113/67  Pulse: 87  Resp: 18  Temp: (!) 96 F (35.6 C)  SpO2: 99%   Filed Weights   11/20/23 1406  Weight: 183 lb 8 oz (83.2 kg)    Physical Exam Constitutional:      General: He is not in acute distress. HENT:     Head: Normocephalic and atraumatic.  Eyes:     General: No scleral icterus. Cardiovascular:     Rate and Rhythm: Normal rate.  Pulmonary:     Effort: Pulmonary effort is normal. No respiratory distress.  Abdominal:     General: There is no distension.  Musculoskeletal:        General: Normal range of motion.     Cervical back: Normal range of motion and neck supple.  Skin:    Findings: No erythema or rash.  Neurological:     Mental Status: He is alert and oriented to person, place, and time. Mental status is at baseline.  Psychiatric:        Mood and Affect: Mood normal.     LABORATORY DATA:  I have reviewed the data as listed    Latest Ref Rng & Units 10/25/2023   12:30 PM 03/20/2017   11:19 AM 10/16/2016    3:23 PM  CBC  WBC 4.0 - 10.5 K/uL 6.3  6.1  6.7    Hemoglobin 13.0 - 17.0  g/dL 85.6  87.0  86.5   Hematocrit 39.0 - 52.0 % 42.6  38.5  40.9   Platelets 150 - 400 K/uL 212  184  207       Latest Ref Rng & Units 03/20/2017   11:19 AM 10/16/2016    3:23 PM  CMP  Glucose 65 - 99 mg/dL 838  882   BUN 6 - 20 mg/dL 22  23   Creatinine 9.38 - 1.24 mg/dL 8.76  8.68   Sodium 864 - 145 mmol/L 139  139   Potassium 3.5 - 5.1 mmol/L 4.1  5.1   Chloride 101 - 111 mmol/L 105  105   CO2 22 - 32 mmol/L 26  24   Calcium 8.9 - 10.3 mg/dL 8.5  9.3       RADIOGRAPHIC STUDIES: I have personally reviewed the radiological images as listed and agreed with the findings in the report. MR Abdomen W Wo Contrast Result Date: 10/30/2023 CLINICAL DATA:  Follow-up pancreatic cystic lesion EXAM: MRI ABDOMEN WITHOUT AND WITH CONTRAST TECHNIQUE: Multiplanar multisequence MR imaging of the abdomen was performed both before and after the administration of intravenous contrast. CONTRAST:  7.5mL GADAVIST  GADOBUTROL  1 MMOL/ML IV SOLN COMPARISON:  06/09/2021 FINDINGS: Lower chest: No acute abnormality. Hepatobiliary: No solid liver abnormality is seen. No gallstones, gallbladder wall thickening, or biliary dilatation. Pancreas: Unchanged small fluid signal cystic lesions in the pancreatic head, largest measuring up to 1.5 cm (series 3, image 17, series 4, image 23). No solid mass or suspicious contrast enhancement. No pancreatic ductal dilatation or surrounding inflammatory changes. Spleen: Normal in size without significant abnormality. Adrenals/Urinary Tract: Adrenal glands are unremarkable. Numerous bilateral simple, thinly septated, and hemorrhagic or proteinaceous renal cortical cysts in both kidneys, not significantly changed when compared to prior examination dated 2023, all of which remain Bosniak category I and II. Kidneys are otherwise normal, without obvious renal calculi, solid lesion, or hydronephrosis. Stomach/Bowel: Stomach is within normal limits. Descending duodenal  diverticulum. No evidence of bowel wall thickening, distention, or inflammatory changes. Vascular/Lymphatic: No significant vascular findings are present. No enlarged abdominal lymph nodes. Other: No abdominal wall hernia or abnormality. No ascites. Musculoskeletal: No acute or significant osseous findings. IMPRESSION: 1. Unchanged small fluid signal cystic lesions in the pancreatic head, largest measuring up to 1.5 cm. No solid mass or suspicious contrast enhancement. No pancreatic ductal dilatation or surrounding inflammatory changes. These are most consistent with side branch IPMNs. Given small size, established imaging stability, and advanced patient age, no further follow-up or characterization is required. 2. No acute findings in the abdomen. Electronically Signed   By: Marolyn JONETTA Jaksch M.D.   On: 10/30/2023 08:36

## 2024-02-19 ENCOUNTER — Ambulatory Visit: Admitting: Podiatry

## 2024-02-26 ENCOUNTER — Ambulatory Visit (INDEPENDENT_AMBULATORY_CARE_PROVIDER_SITE_OTHER): Admitting: Podiatry

## 2024-02-26 DIAGNOSIS — M79674 Pain in right toe(s): Secondary | ICD-10-CM

## 2024-02-26 DIAGNOSIS — B351 Tinea unguium: Secondary | ICD-10-CM | POA: Diagnosis not present

## 2024-02-26 DIAGNOSIS — M79675 Pain in left toe(s): Secondary | ICD-10-CM

## 2024-02-26 NOTE — Progress Notes (Signed)
" ° °  Chief Complaint  Patient presents with   Nail Problem    DFC. A1c  8.1     Subjective: Patient is a 89 y.o. male PMHx DM type II presenting to the office today for routine foot care of callus lesions of the bilateral feet and elongated toenails to the bilateral feet. Walking increases the pain.  He is unable to trim his own nails. Patient presents today for further treatment and evaluation.  Past Medical History:  Diagnosis Date   Anemia    B12 deficiency    Bradycardia    Chronic kidney disease    chronic renal insufficency   Diabetes mellitus without complication (HCC)    Diverticulosis 01/25/2016   GERD (gastroesophageal reflux disease)    Hyperkalemia    Hyperlipidemia    Hypertension    Impotence    Pneumonia    PVC's (premature ventricular contractions)    Sleep apnea    no CPAP in 15 years   Wears dentures    partial lower    Objective:  Physical Exam General: Alert and oriented x3 in no acute distress  Dermatology: Hyperkeratotic lesions present on the bilateral feet. Pain on palpation with a central nucleated core noted. Skin is warm, dry and supple bilateral lower extremities. Negative for open lesions or macerations. Nails are tender, long, thickened and dystrophic with subungual debris, consistent with onychomycosis, 1-5 bilateral. No signs of infection noted.  Vascular: Palpable pedal pulses bilaterally. No edema or erythema noted. Capillary refill within normal limits.  Neurological: Grossly intact via light touch  Musculoskeletal Exam: Unchanged pain on palpation at the keratotic lesion noted. Range of motion within normal limits bilateral. Muscle strength 5/5 in all groups bilateral.  Hammertoe contracture of the left second toe with overlap onto the hallux.  Hallux valgus deformity also noted  Assessment: 1.  Pain due to onychomycosis of toenails both  2.  Pre-ulcerative callus lesions noted to the bilateral feet x2 3.  Hallux valgus and hammertoe  deformity bilateral  Plan of Care:  -Patient evaluated -Mechanical debridement of nails 1-5 bilateral was performed using a nail nipper without incident incident -Excisional debridement of the hyperkeratotic callus lesions was performed today with the 312 scalpel without incident or bleeding -Continue conservative management for the hammertoe and severe hallux valgus deformity.  It is minimally symptomatic and managed with appropriate shoes -Return to clinic 3 months routine footcare  Thresa EMERSON Sar, DPM [facility]  Dr. Thresa EMERSON Sar, DPM    [address] [place], [place] [address]                Office [phone]  Fax [phone] "

## 2024-03-20 ENCOUNTER — Other Ambulatory Visit: Payer: Self-pay | Admitting: Student

## 2024-03-20 DIAGNOSIS — M5416 Radiculopathy, lumbar region: Secondary | ICD-10-CM

## 2024-03-28 ENCOUNTER — Ambulatory Visit: Admission: RE | Admit: 2024-03-28

## 2024-03-28 DIAGNOSIS — M5416 Radiculopathy, lumbar region: Secondary | ICD-10-CM

## 2024-06-03 ENCOUNTER — Ambulatory Visit: Admitting: Podiatry
